# Patient Record
Sex: Male | Born: 1935 | Race: White | Hispanic: No | Smoking: Never smoker
Health system: Southern US, Community
[De-identification: ages and names within clinical notes are randomized; demographics above are authoritative.]

## PROBLEM LIST (undated history)

## (undated) DIAGNOSIS — I509 Heart failure, unspecified: Secondary | ICD-10-CM

## (undated) DIAGNOSIS — N289 Disorder of kidney and ureter, unspecified: Secondary | ICD-10-CM

## (undated) DIAGNOSIS — F0280 Dementia in other diseases classified elsewhere without behavioral disturbance: Secondary | ICD-10-CM

## (undated) DIAGNOSIS — G309 Alzheimer's disease, unspecified: Secondary | ICD-10-CM

---

## 2014-11-20 ENCOUNTER — Encounter (HOSPITAL_COMMUNITY): Payer: Self-pay

## 2014-11-20 ENCOUNTER — Inpatient Hospital Stay (HOSPITAL_COMMUNITY)
Admission: EM | Admit: 2014-11-20 | Discharge: 2014-11-28 | DRG: 291 | Disposition: A | Discharge status: Home Health | Payer: Medicare Other | Attending: Internal Medicine | Admitting: Internal Medicine

## 2014-11-20 ENCOUNTER — Emergency Department (HOSPITAL_COMMUNITY): Payer: Medicare Other

## 2014-11-20 DIAGNOSIS — R0602 Shortness of breath: Secondary | ICD-10-CM | POA: Diagnosis present

## 2014-11-20 DIAGNOSIS — Z7982 Long term (current) use of aspirin: Secondary | ICD-10-CM

## 2014-11-20 DIAGNOSIS — I5021 Acute systolic (congestive) heart failure: Secondary | ICD-10-CM

## 2014-11-20 DIAGNOSIS — Z888 Allergy status to other drugs, medicaments and biological substances status: Secondary | ICD-10-CM

## 2014-11-20 DIAGNOSIS — I959 Hypotension, unspecified: Secondary | ICD-10-CM | POA: Diagnosis present

## 2014-11-20 DIAGNOSIS — Z66 Do not resuscitate: Secondary | ICD-10-CM | POA: Diagnosis present

## 2014-11-20 DIAGNOSIS — N189 Chronic kidney disease, unspecified: Secondary | ICD-10-CM | POA: Diagnosis not present

## 2014-11-20 DIAGNOSIS — I5022 Chronic systolic (congestive) heart failure: Secondary | ICD-10-CM | POA: Insufficient documentation

## 2014-11-20 DIAGNOSIS — R10819 Abdominal tenderness, unspecified site: Secondary | ICD-10-CM

## 2014-11-20 DIAGNOSIS — R001 Bradycardia, unspecified: Secondary | ICD-10-CM | POA: Diagnosis present

## 2014-11-20 DIAGNOSIS — Z781 Physical restraint status: Secondary | ICD-10-CM

## 2014-11-20 DIAGNOSIS — R7989 Other specified abnormal findings of blood chemistry: Secondary | ICD-10-CM | POA: Diagnosis not present

## 2014-11-20 DIAGNOSIS — N184 Chronic kidney disease, stage 4 (severe): Secondary | ICD-10-CM | POA: Diagnosis present

## 2014-11-20 DIAGNOSIS — I509 Heart failure, unspecified: Secondary | ICD-10-CM

## 2014-11-20 DIAGNOSIS — F0281 Dementia in other diseases classified elsewhere with behavioral disturbance: Secondary | ICD-10-CM | POA: Diagnosis present

## 2014-11-20 DIAGNOSIS — N183 Chronic kidney disease, stage 3 unspecified: Secondary | ICD-10-CM | POA: Diagnosis present

## 2014-11-20 DIAGNOSIS — F05 Delirium due to known physiological condition: Secondary | ICD-10-CM | POA: Diagnosis present

## 2014-11-20 DIAGNOSIS — N179 Acute kidney failure, unspecified: Secondary | ICD-10-CM | POA: Diagnosis present

## 2014-11-20 DIAGNOSIS — I5023 Acute on chronic systolic (congestive) heart failure: Secondary | ICD-10-CM | POA: Diagnosis present

## 2014-11-20 DIAGNOSIS — F0280 Dementia in other diseases classified elsewhere without behavioral disturbance: Secondary | ICD-10-CM | POA: Diagnosis present

## 2014-11-20 DIAGNOSIS — R0600 Dyspnea, unspecified: Secondary | ICD-10-CM | POA: Diagnosis not present

## 2014-11-20 DIAGNOSIS — G309 Alzheimer's disease, unspecified: Secondary | ICD-10-CM | POA: Diagnosis present

## 2014-11-20 DIAGNOSIS — I2510 Atherosclerotic heart disease of native coronary artery without angina pectoris: Secondary | ICD-10-CM | POA: Diagnosis present

## 2014-11-20 DIAGNOSIS — R778 Other specified abnormalities of plasma proteins: Secondary | ICD-10-CM | POA: Diagnosis present

## 2014-11-20 DIAGNOSIS — Z79899 Other long term (current) drug therapy: Secondary | ICD-10-CM | POA: Diagnosis not present

## 2014-11-20 DIAGNOSIS — J9601 Acute respiratory failure with hypoxia: Secondary | ICD-10-CM | POA: Diagnosis present

## 2014-11-20 HISTORY — DX: Disorder of kidney and ureter, unspecified: N28.9

## 2014-11-20 HISTORY — DX: Alzheimer's disease, unspecified: G30.9

## 2014-11-20 HISTORY — DX: Heart failure, unspecified: I50.9

## 2014-11-20 HISTORY — DX: Dementia in other diseases classified elsewhere, unspecified severity, without behavioral disturbance, psychotic disturbance, mood disturbance, and anxiety: F02.80

## 2014-11-20 LAB — BASIC METABOLIC PANEL
Anion gap: 7 (ref 5–15)
BUN: 26 mg/dL — ABNORMAL HIGH (ref 6–20)
CHLORIDE: 106 mmol/L (ref 101–111)
CO2: 27 mmol/L (ref 22–32)
Calcium: 8.4 mg/dL — ABNORMAL LOW (ref 8.9–10.3)
Creatinine, Ser: 1.54 mg/dL — ABNORMAL HIGH (ref 0.61–1.24)
GFR calc Af Amer: 45 mL/min — ABNORMAL LOW (ref >60)
GFR, EST NON AFRICAN AMERICAN: 39 mL/min — AB (ref >60)
GLUCOSE: 105 mg/dL — AB (ref 65–99)
POTASSIUM: 4.4 mmol/L (ref 3.5–5.1)
SODIUM: 140 mmol/L (ref 135–145)

## 2014-11-20 LAB — CBC WITH DIFFERENTIAL/PLATELET
Basophils Absolute: 0 10*3/uL (ref 0.0–0.1)
Basophils Relative: 1 % (ref 0–1)
Eosinophils Absolute: 0.2 10*3/uL (ref 0.0–0.7)
Eosinophils Relative: 4 % (ref 0–5)
HCT: 38.8 % — ABNORMAL LOW (ref 39.0–52.0)
Hemoglobin: 12.5 g/dL — ABNORMAL LOW (ref 13.0–17.0)
LYMPHS ABS: 1.5 10*3/uL (ref 0.7–4.0)
Lymphocytes Relative: 27 % (ref 12–46)
MCH: 28.8 pg (ref 26.0–34.0)
MCHC: 32.2 g/dL (ref 30.0–36.0)
MCV: 89.4 fL (ref 78.0–100.0)
MONO ABS: 0.6 10*3/uL (ref 0.1–1.0)
Monocytes Relative: 11 % (ref 3–12)
NEUTROS PCT: 57 % (ref 43–77)
Neutro Abs: 3.2 10*3/uL (ref 1.7–7.7)
Platelets: 235 10*3/uL (ref 150–400)
RBC: 4.34 MIL/uL (ref 4.22–5.81)
RDW: 13.5 % (ref 11.5–15.5)
WBC: 5.5 10*3/uL (ref 4.0–10.5)

## 2014-11-20 LAB — BRAIN NATRIURETIC PEPTIDE: B NATRIURETIC PEPTIDE 5: 1133 pg/mL — AB (ref 0.0–100.0)

## 2014-11-20 LAB — PROTIME-INR
INR: 1.13 (ref 0.00–1.49)
Prothrombin Time: 14.7 seconds (ref 11.6–15.2)

## 2014-11-20 LAB — TROPONIN I: Troponin I: 0.04 ng/mL — ABNORMAL HIGH (ref <0.031)

## 2014-11-20 LAB — APTT: APTT: 27 s (ref 24–37)

## 2014-11-20 MED ORDER — ALBUTEROL SULFATE (2.5 MG/3ML) 0.083% IN NEBU
5.0000 mg | INHALATION_SOLUTION | Freq: Once | RESPIRATORY_TRACT | Status: AC
  Administered 2014-11-20: 5 mg via RESPIRATORY_TRACT
  Filled 2014-11-20: qty 6

## 2014-11-20 MED ORDER — FUROSEMIDE 10 MG/ML IJ SOLN
40.0000 mg | INTRAMUSCULAR | Status: AC
  Administered 2014-11-20: 40 mg via INTRAVENOUS
  Filled 2014-11-20: qty 4

## 2014-11-20 MED ORDER — ASPIRIN 81 MG PO CHEW
324.0000 mg | CHEWABLE_TABLET | Freq: Once | ORAL | Status: AC
  Administered 2014-11-20: 324 mg via ORAL
  Filled 2014-11-20: qty 4

## 2014-11-20 NOTE — ED Provider Notes (Signed)
CSN: [id number]     Arrival date & time 11/20/14  2113 History   None    This chart was scribed for Eber HongBrian Haillee Johann, MD by Arlan OrganAshley Leger, ED Scribe. This patient was seen in room APA01/APA01 and the patient's care was started 9:14 PM.   Chief Complaint  Patient presents with  . Shortness of Breath   The history is provided by the patient. No language interpreter was used.    LEVEL 5 CAVEAT DUE TO HISTORY OF DEMENTIA  HPI Comments: John [facility] brought in by EMS [facility] is a 81 y.o. male without any pertinent past medical history who presents to the Emergency Department here for constant, ongoing shortness of breath on exertion this evening. Staff noticed pt looked more short of breath after eating dinner this evening. He denies any pain at this time. No tachycardia or hypoxia per EMS. No interventions reported. No known allergies to medications. The pt follows commands but is non verbal.  Past Medical History  Diagnosis Date  . CHF (congestive heart failure)   . Alzheimer's dementia   . Renal disorder    History reviewed. No pertinent past surgical history. No family history on file. History  Substance Use Topics  . Smoking status: Never Smoker   . Smokeless tobacco: Not on file  . Alcohol Use: No    Review of Systems  Unable to perform ROS: Dementia  Respiratory: Positive for shortness of breath.   All other systems reviewed and are negative.     Allergies  Ace inhibitors  Home Medications   Prior to Admission medications   Medication Sig Start Date End Date Taking? Authorizing Provider  acetaminophen (TYLENOL) 500 MG tablet Take 500 mg by mouth every 4 (four) hours as needed (**To take every 4 hours as needed for 24 hours for fever 99.5 to 101. to monitor temperature three times daily until normal. If over 101, contact MD but not to exceed 2000mg  in 24 hours).   Yes Historical Provider, MD  alum & mag hydroxide-simeth (MAALOX/MYLANTA)  200-200-20 MG/5ML suspension Take 30 mLs by mouth daily as needed for indigestion or heartburn.   Yes Historical Provider, MD  aspirin EC 81 MG tablet Take 81 mg by mouth daily.   Yes Historical Provider, MD  Cholecalciferol (VITAMIN D) 2000 UNITS CAPS Take 1 capsule by mouth daily.   Yes Historical Provider, MD  docusate sodium (COLACE) 100 MG capsule Take 100 mg by mouth 2 (two) times daily.   Yes Historical Provider, MD  guaiFENesin (ROBITUSSIN) 100 MG/5ML liquid Take 200 mg by mouth every 6 (six) hours as needed for cough.   Yes Historical Provider, MD  loperamide (IMODIUM) 2 MG capsule Take 2 mg by mouth as needed for diarrhea or loose stools.   Yes Historical Provider, MD  LORazepam in dextrose solution Inject 1 mg into the vein every 6 (six) hours as needed (for anxiety. **Not to exceed 3 doses within 24 hours   Yes Historical Provider, MD  losartan (COZAAR) 25 MG tablet Take 12.5 mg by mouth daily.   Yes Historical Provider, MD  magnesium hydroxide (MILK OF MAGNESIA) 400 MG/5ML suspension Take 30 mLs by mouth at bedtime as needed for mild constipation.   Yes Historical Provider, MD  memantine (NAMENDA) 10 MG tablet Take 10 mg by mouth 2 (two) times daily.   Yes Historical Provider, MD  mirtazapine (REMERON) 45 MG tablet Take 45 mg by mouth at bedtime.   Yes Historical  Provider, MD  OLANZapine (ZYPREXA) 5 MG tablet Take 5 mg by mouth every morning.   Yes Historical Provider, MD  risperiDONE (RISPERDAL) 2 MG tablet Take 2 mg by mouth 2 (two) times daily.   Yes Historical Provider, MD  traZODone (DESYREL) 100 MG tablet Take 100 mg by mouth at bedtime.   Yes Historical Provider, MD  polyethylene glycol (MIRALAX / GLYCOLAX) packet Take 17 g by mouth daily.    Historical Provider, MD   Triage Vitals: BP 108/75 mmHg  Pulse 94  Temp(Src) 98.1 F (36.7 C) (Oral)  Resp 23  SpO2 100%   Physical Exam  Constitutional: He appears well-developed and well-nourished. No distress.  HENT:  Head:  Normocephalic and atraumatic.  Mouth/Throat: Oropharynx is clear and moist. No oropharyngeal exudate.  Eyes: Conjunctivae and EOM are normal. Pupils are equal, round, and reactive to light. Right eye exhibits no discharge. Left eye exhibits no discharge. No scleral icterus.  Neck: Normal range of motion. Neck supple. No JVD present. No thyromegaly present.  Cardiovascular: Normal rate, regular rhythm, normal heart sounds and intact distal pulses.  Exam reveals no gallop and no friction rub.   No murmur heard. Pulmonary/Chest: Effort normal. No respiratory distress. He has wheezes. He has rales.  Soft  rales at bases with expiratory wheezing Mild tachypnea   Abdominal: Soft. Bowel sounds are normal. He exhibits no distension and no mass. There is no tenderness.  Musculoskeletal: Normal range of motion. He exhibits edema. He exhibits no tenderness.  Edema 1 plus bilaterally, symmetrical   Lymphadenopathy:    He has no cervical adenopathy.  Neurological: Coordination normal.  Follows commands but disoriented   Skin: Skin is warm and dry. No rash noted. No erythema.  Superficial ulcerations to L anterior shin x 3 Normal capillary refill at the toes  Psychiatric: He has a normal mood and affect. His behavior is normal.  Nursing note and vitals reviewed.   ED Course  Procedures (including critical care time)  DIAGNOSTIC STUDIES: Oxygen Saturation is 98% on RA, Normal by my interpretation.    COORDINATION OF CARE: 9:28 PM- Will order CXR, BMP, CBC, APTT, Protime-INR, Troponin I, brain natriuretic peptide, and EKG. Discussed treatment plan with pt at bedside and pt agreed to plan.     Labs Review Labs Reviewed  BASIC METABOLIC PANEL - Abnormal; Notable for the following:    Glucose, Bld 105 (*)    BUN 26 (*)    Creatinine, Ser 1.54 (*)    Calcium 8.4 (*)    GFR calc non Af Amer 39 (*)    GFR calc Af Amer 45 (*)    All other components within normal limits  CBC WITH  DIFFERENTIAL/PLATELET - Abnormal; Notable for the following:    Hemoglobin 12.5 (*)    HCT 38.8 (*)    All other components within normal limits  TROPONIN I - Abnormal; Notable for the following:    Troponin I 0.04 (*)    All other components within normal limits  BRAIN NATRIURETIC PEPTIDE - Abnormal; Notable for the following:    B Natriuretic Peptide 1133.0 (*)    All other components within normal limits  APTT  PROTIME-INR    Imaging Review Dg Chest Port 1 View  11/20/2014   CLINICAL DATA:  Shortness of breath for a couple hours, generalized chest pain in past 15 minutes, history CHF, dementia, renal disease  EXAM: PORTABLE CHEST - 1 VIEW  COMPARISON:  Portable exam 2144 hours without priors  for comparison  FINDINGS: Enlargement of cardiac silhouette with pulmonary vascular congestion.  Mediastinal contours normal.  Perihilar infiltrates compatible with pulmonary edema and CHF.  No segmental consolidation, pleural effusion or pneumothorax.  Bones demineralized.  IMPRESSION: CHF.   Electronically Signed   By: Ulyses SouthwardMark  Boles M.D.   On: 11/20/2014 22:17     EKG Interpretation   Date/Time:  Thursday Nov 20 2014 21:29:05 EDT Ventricular Rate:  100 PR Interval:  229 QRS Duration: 125 QT Interval:  384 QTC Calculation: 495 R Axis:   -29 Text Interpretation:  Sinus tachycardia Paired ventricular premature  complexes Prolonged PR interval Probable left ventricular hypertrophy  Anterior Q waves, possibly due to LVH Abnormal T, consider ischemia,  lateral leads Abnormal ekg No old tracing to compare Confirmed by Heaven Wandell   MD, Veldon Wager ([id number]) on 11/20/2014 11:04:47 PM      MDM   Final diagnoses:  SOB (shortness of breath)  Acute systolic congestive heart failure   The patient has congestive heart failure, his blood pressure is 103/70, heart rate is normal, oxygenation is 97% however on exam he has rales, cardiac wheeze in peripheral edema consistent with fluid retention. His medical record  suggests that he has congestive heart failure, systolic. There is no echocardiogram on the chart, the patient is unable to give me any other information due to his dementia. He is tachypneic, he will get Lasix, albuterol nebulizer, admission to the hospitalist service for fluid overload with acute systolic congestive heart failure exacerbation. The patient does not need advanced noninvasive ventilation at this time.  D/w Dr. Onalee Huaavid who will admit  Meds given in ED:  Medications  furosemide (LASIX) injection 40 mg (not administered)  aspirin chewable tablet 324 mg (not administered)  albuterol (PROVENTIL) (2.5 MG/3ML) 0.083% nebulizer solution 5 mg (5 mg Nebulization Given 11/20/14 2312)    New Prescriptions   No medications on file    I personally performed the services described in this documentation, which was scribed in my presence. The recorded information has been reviewed and is accurate.     Eber HongBrian Jettson Crable, MD 11/20/14 [phone]

## 2014-11-20 NOTE — ED Notes (Signed)
[facility] updated on plan of care,

## 2014-11-20 NOTE — ED Notes (Signed)
Son's contact information [facility] [place] phone first [phone] Home [phone]

## 2014-11-20 NOTE — ED Notes (Signed)
Pt has wheezing noted, appears sob, Dr Hyacinth MeekerMiller notified, additional orders given,

## 2014-11-20 NOTE — ED Notes (Signed)
[facility] by ems after getting sob right after eating supper tonight.  Pt denies pain, appears minimally sob at this time

## 2014-11-20 NOTE — ED Notes (Signed)
Son states that he has noticed that pt has been sob for the past two days,

## 2014-11-20 NOTE — ED Notes (Signed)
After giving pt the aspirin, pt coughed for several times,

## 2014-11-20 NOTE — ED Notes (Signed)
Pt c/o sob that started this evening, denies any pain, lab in room at present time, pt NSR on monitor,

## 2014-11-21 ENCOUNTER — Inpatient Hospital Stay (HOSPITAL_COMMUNITY): Payer: Medicare Other

## 2014-11-21 DIAGNOSIS — I5023 Acute on chronic systolic (congestive) heart failure: Secondary | ICD-10-CM | POA: Diagnosis present

## 2014-11-21 DIAGNOSIS — R0600 Dyspnea, unspecified: Secondary | ICD-10-CM

## 2014-11-21 DIAGNOSIS — I509 Heart failure, unspecified: Secondary | ICD-10-CM

## 2014-11-21 DIAGNOSIS — I5022 Chronic systolic (congestive) heart failure: Secondary | ICD-10-CM

## 2014-11-21 DIAGNOSIS — R0602 Shortness of breath: Secondary | ICD-10-CM

## 2014-11-21 DIAGNOSIS — N189 Chronic kidney disease, unspecified: Secondary | ICD-10-CM

## 2014-11-21 DIAGNOSIS — J9601 Acute respiratory failure with hypoxia: Secondary | ICD-10-CM

## 2014-11-21 DIAGNOSIS — R7989 Other specified abnormal findings of blood chemistry: Secondary | ICD-10-CM

## 2014-11-21 DIAGNOSIS — F0280 Dementia in other diseases classified elsewhere without behavioral disturbance: Secondary | ICD-10-CM

## 2014-11-21 DIAGNOSIS — G309 Alzheimer's disease, unspecified: Secondary | ICD-10-CM

## 2014-11-21 LAB — TROPONIN I
TROPONIN I: 0.04 ng/mL — AB (ref <0.031)
Troponin I: 0.03 ng/mL (ref <0.031)
Troponin I: 0.03 ng/mL (ref <0.031)

## 2014-11-21 MED ORDER — SODIUM CHLORIDE 0.9 % IJ SOLN: 3.0000 mL | INTRAMUSCULAR | Status: DC | PRN

## 2014-11-21 MED ORDER — MIRTAZAPINE 30 MG PO TABS
45.0000 mg | ORAL_TABLET | Freq: Every day | ORAL | Status: DC
  Administered 2014-11-21 – 2014-11-27 (×7): 45 mg via ORAL
  Filled 2014-11-21 (×18): qty 1

## 2014-11-21 MED ORDER — FUROSEMIDE 10 MG/ML IJ SOLN
40.0000 mg | Freq: Every day | INTRAMUSCULAR | Status: DC
  Administered 2014-11-21 – 2014-11-24 (×4): 40 mg via INTRAVENOUS
  Filled 2014-11-21 (×4): qty 4

## 2014-11-21 MED ORDER — ONDANSETRON HCL 4 MG/2ML IJ SOLN: 4.0000 mg | Freq: Three times a day (TID) | INTRAMUSCULAR | Status: AC | PRN

## 2014-11-21 MED ORDER — ONDANSETRON HCL 4 MG/2ML IJ SOLN: 4.0000 mg | Freq: Four times a day (QID) | INTRAMUSCULAR | Status: DC | PRN

## 2014-11-21 MED ORDER — ENOXAPARIN SODIUM 40 MG/0.4ML ~~LOC~~ SOLN
40.0000 mg | SUBCUTANEOUS | Status: DC
  Administered 2014-11-21 – 2014-11-28 (×8): 40 mg via SUBCUTANEOUS
  Filled 2014-11-21 (×8): qty 0.4

## 2014-11-21 MED ORDER — ENOXAPARIN SODIUM 40 MG/0.4ML ~~LOC~~ SOLN: 40.0000 mg | SUBCUTANEOUS | Status: DC

## 2014-11-21 MED ORDER — ASPIRIN EC 81 MG PO TBEC
81.0000 mg | DELAYED_RELEASE_TABLET | Freq: Every day | ORAL | Status: DC
  Administered 2014-11-21 – 2014-11-28 (×8): 81 mg via ORAL
  Filled 2014-11-21 (×8): qty 1

## 2014-11-21 MED ORDER — LORAZEPAM 0.5 MG PO TABS
0.5000 mg | ORAL_TABLET | Freq: Two times a day (BID) | ORAL | Status: DC | PRN
  Administered 2014-11-22 – 2014-11-24 (×5): 0.5 mg via ORAL
  Filled 2014-11-21 (×5): qty 1

## 2014-11-21 MED ORDER — SODIUM CHLORIDE 0.9 % IJ SOLN
3.0000 mL | Freq: Two times a day (BID) | INTRAMUSCULAR | Status: DC
  Administered 2014-11-21 – 2014-11-28 (×16): 3 mL via INTRAVENOUS

## 2014-11-21 MED ORDER — SODIUM CHLORIDE 0.9 % IV SOLN: 250.0000 mL | INTRAVENOUS | Status: DC | PRN

## 2014-11-21 MED ORDER — LORAZEPAM 2 MG/ML IJ SOLN
1.0000 mg | Freq: Once | INTRAMUSCULAR | Status: AC
  Administered 2014-11-21: 1 mg via INTRAVENOUS
  Filled 2014-11-21: qty 1

## 2014-11-21 MED ORDER — RISPERIDONE 1 MG PO TABS
2.0000 mg | ORAL_TABLET | Freq: Two times a day (BID) | ORAL | Status: DC
  Administered 2014-11-21 – 2014-11-28 (×15): 2 mg via ORAL
  Filled 2014-11-21 (×2): qty 2
  Filled 2014-11-21: qty 1
  Filled 2014-11-21: qty 2
  Filled 2014-11-21: qty 1
  Filled 2014-11-21 (×11): qty 2
  Filled 2014-11-21: qty 1
  Filled 2014-11-21 (×2): qty 2
  Filled 2014-11-21: qty 1

## 2014-11-21 MED ORDER — LORAZEPAM 2 MG/ML IJ SOLN
INTRAMUSCULAR | Status: AC
  Filled 2014-11-21: qty 1

## 2014-11-21 MED ORDER — LORAZEPAM 2 MG/ML IJ SOLN
1.0000 mg | Freq: Once | INTRAMUSCULAR | Status: AC
  Administered 2014-11-21: 1 mg via INTRAVENOUS

## 2014-11-21 MED ORDER — MEMANTINE HCL 10 MG PO TABS
10.0000 mg | ORAL_TABLET | Freq: Two times a day (BID) | ORAL | Status: DC
  Administered 2014-11-21 – 2014-11-28 (×15): 10 mg via ORAL
  Filled 2014-11-21 (×20): qty 1

## 2014-11-21 MED ORDER — ACETAMINOPHEN 325 MG PO TABS
650.0000 mg | ORAL_TABLET | ORAL | Status: DC | PRN
  Administered 2014-11-23: 650 mg via ORAL
  Filled 2014-11-21: qty 2

## 2014-11-21 MED ORDER — CARVEDILOL 3.125 MG PO TABS
3.1250 mg | ORAL_TABLET | Freq: Two times a day (BID) | ORAL | Status: DC
  Administered 2014-11-22 – 2014-11-28 (×9): 3.125 mg via ORAL
  Filled 2014-11-21 (×11): qty 1

## 2014-11-21 MED ORDER — TRAZODONE HCL 50 MG PO TABS
100.0000 mg | ORAL_TABLET | Freq: Every day | ORAL | Status: DC
  Administered 2014-11-21 – 2014-11-27 (×8): 100 mg via ORAL
  Filled 2014-11-21 (×8): qty 2

## 2014-11-21 MED ORDER — POLYETHYLENE GLYCOL 3350 17 G PO PACK
17.0000 g | PACK | Freq: Every day | ORAL | Status: DC
  Administered 2014-11-21 – 2014-11-28 (×7): 17 g via ORAL
  Filled 2014-11-21 (×8): qty 1

## 2014-11-21 NOTE — Progress Notes (Signed)
Contacted [facility] in regards to pt's code status since it wasn't documented in the paper work. [facility] states that the pt is a full code.

## 2014-11-21 NOTE — Progress Notes (Signed)
This patient was admitted to the hospital earlier this morning by Dr. Onalee Huaavid  Patient seen and examined. Noted to have crackles in the lower lobes bilaterally. No significant lower extremity edema.  He was admitted with shortness of breath, acute respiratory failure with hypoxia and acute on chronic systolic congestive heart failure. Echocardiogram done today shows an ejection fraction of 20-25% with diffuse hypokinesis. He is started on intravenous Lasix with good urine output. We'll try to wean down oxygen as tolerated. Creatinine is mildly elevated at 1.5. Unclear if this is baseline since we do not have any prior blood work. We'll start him on low-dose Coreg. Continue to monitor urine output.  Nadelyn Enriques

## 2014-11-21 NOTE — Care Management Note (Signed)
Case Management Note  Patient Details  Name: John RoanLassie Kidd MRN: [id number] Date of Birth: 04/12/1927  Expected Discharge Date:                  Expected Discharge Plan:  Assisted Living / Rest Home  In-House Referral:  Clinical Social Work  Discharge planning Services  CM Consult  Post Acute Care Choice:  NA Choice offered to:  NA  DME Arranged:    DME Agency:     HH Arranged:    HH Agency:     Status of Service:  Completed, signed off  Medicare Important Message Given:  Yes Date Medicare IM Given:  11/21/14 Medicare IM give by:  Kathyrn SheriffJessica Montae Stager, RN, MSN, CM  Date Additional Medicare IM Given:    Additional Medicare Important Message give by:     If discussed at Long Length of Stay Meetings, dates discussed:    Additional Comments: [facility]. Pt admitted for CHF. Discharge plan is for patient to return to ALF at DC. CSW is aware and will arrange for return to facility. No CM needs.   [name], Anela Bensman Demske, RN 11/21/2014, 8:57 AM

## 2014-11-21 NOTE — Clinical Social Work Note (Addendum)
Clinical Social Work Assessment  Patient Details  Name: John Kidd MRN: [id number] Date of Birth: 01/06/1927  Date of referral:  11/21/14               Reason for consult:  Facility Placement                Permission sought to share information with:    Permission granted to share information::     Name::        Agency::     Relationship::     Contact Information:     Housing/Transportation Living arrangements for the past 2 months:  Assisted Living Facility Source of Information:  Spouse Patient Interpreter Needed:  None Criminal Activity/Legal Involvement Pertinent to Current Situation/Hospitalization:  No - Comment as needed Significant Relationships:  Adult Children, Spouse Lives with:  Facility Resident Do you feel safe going back to the place where you live?  Yes Need for family participation in patient care:  Yes (Comment) (Family visit very regularly)  Care giving concerns:  [facility].    Social Worker assessment / plan:  CSW spoke with pt's wife, John Kidd on phone as pt is oriented to self only and very agitated. Pt has [facility] a [facility] on the memory care unit for over a year now. She states that her son drives her to see pt often. They plan to come to hospital today. John Kidd has been pleased with facility and requests return at d/c. Pt's son, John Kidd also called and verified information. [facility], pt ambulates independently, but requires assist with all other ADLs. He can be combative with staff there. Okay for return unless decline in functional status requires assessment.   Employment status:  Retired Database administratornsurance information:  Managed Medicare PT Recommendations:  Not assessed at this time Information / Referral to community resources:   [employer]([facility])  Patient/Family's Response to care:  Pt's [facility] She states pt has been doing well there.   Patient/Family's Understanding of and  Emotional Response to Diagnosis, Current Treatment, and Prognosis:  Pt's wife shared that pt was at home with her until January 2015. Pt did not want to sleep, was wandering, and weak. She felt that he needed to be on memory care unit for safety and has been happy with this decision.   Emotional Assessment Appearance:  Other (Comment Required (not assessed) Attitude/Demeanor/Rapport:  Combative Affect (typically observed):  Unable to Assess Orientation:  Oriented to Self Alcohol / Substance use:  Not Applicable Psych involvement (Current and /or in the community):  No (Comment)  Discharge Needs  Concerns to be addressed:  Discharge Planning Concerns Readmission within the last 30 days:  No Current discharge risk:  Cognitively Impaired Barriers to Discharge:  Continued Medical Work up   Karn CassisStultz, Caidan Hubbert Shanaberger, LCSW 11/21/2014, 9:24 AM [phone]

## 2014-11-21 NOTE — H&P (Signed)
PCP:   No primary care provider on file.   Chief Complaint:  sob  HPI: 81 yo male from SNF with h/o systolic chf, advanced dementia, ckd sent in for worsening sob.  History all obtained from ER staff, and snf staff.  No report of fevers.  Mild peripheral edema.  Pt cannot express any pain, but appears to be sob and comfortable.    Review of Systems:  Unobtainable from patietn due to dementia  Past Medical History: Past Medical History  Diagnosis Date  . CHF (congestive heart failure)   . Alzheimer's dementia   . Renal disorder    History reviewed. No pertinent past surgical history.  Medications: Prior to Admission medications   Medication Sig Start Date End Date Taking? Authorizing Provider  acetaminophen (TYLENOL) 500 MG tablet Take 500 mg by mouth every 4 (four) hours as needed (**To take every 4 hours as needed for 24 hours for fever 99.5 to 101. to monitor temperature three times daily until normal. If over 101, contact MD but not to exceed 2000mg  in 24 hours).   Yes Historical Provider, MD  alum & mag hydroxide-simeth (MAALOX/MYLANTA) 200-200-20 MG/5ML suspension Take 30 mLs by mouth daily as needed for indigestion or heartburn.   Yes Historical Provider, MD  aspirin EC 81 MG tablet Take 81 mg by mouth daily.   Yes Historical Provider, MD  Cholecalciferol (VITAMIN D) 2000 UNITS CAPS Take 1 capsule by mouth daily.   Yes Historical Provider, MD  docusate sodium (COLACE) 100 MG capsule Take 100 mg by mouth 2 (two) times daily.   Yes Historical Provider, MD  guaiFENesin (ROBITUSSIN) 100 MG/5ML liquid Take 200 mg by mouth every 6 (six) hours as needed for cough.   Yes Historical Provider, MD  loperamide (IMODIUM) 2 MG capsule Take 2 mg by mouth as needed for diarrhea or loose stools.   Yes Historical Provider, MD  LORazepam in dextrose solution Inject 1 mg into the vein every 6 (six) hours as needed (for anxiety. **Not to exceed 3 doses within 24 hours.male ).   Yes Historical  Provider, MD  losartan (COZAAR) 25 MG tablet Take 12.5 mg by mouth daily.   Yes Historical Provider, MD  magnesium hydroxide (MILK OF MAGNESIA) 400 MG/5ML suspension Take 30 mLs by mouth at bedtime as needed for mild constipation.   Yes Historical Provider, MD  memantine (NAMENDA) 10 MG tablet Take 10 mg by mouth 2 (two) times daily.   Yes Historical Provider, MD  mirtazapine (REMERON) 45 MG tablet Take 45 mg by mouth at bedtime.   Yes Historical Provider, MD  OLANZapine (ZYPREXA) 5 MG tablet Take 5 mg by mouth every morning.   Yes Historical Provider, MD  risperiDONE (RISPERDAL) 2 MG tablet Take 2 mg by mouth 2 (two) times daily.   Yes Historical Provider, MD  traZODone (DESYREL) 100 MG tablet Take 100 mg by mouth at bedtime.   Yes Historical Provider, MD  polyethylene glycol (MIRALAX / GLYCOLAX) packet Take 17 g by mouth daily.    Historical Provider, MD    Allergies:   Allergies  Allergen Reactions  . Ace Inhibitors     Social History:  reports that he has never smoked. He does not have any smokeless tobacco history on file. He reports that he does not drink alcohol or use illicit drugs.  Family History: Unobtainable due to pt dementia  Physical Exam: Filed Vitals:   11/20/14 2312 11/20/14 2313 11/20/14 2330 11/20/14 2351  BP:  108/75  110/72   Pulse:  94 84   Temp:    97.6 F (36.4 C)  TempSrc:    Oral  Resp:  23    SpO2: 100% 100% 100%    General appearance: alert, cooperative and no distress Head: Normocephalic, without obvious abnormality, atraumatic Eyes: negative Nose: Nares normal. Septum midline. Mucosa normal. No drainage or sinus tenderness. Neck: no JVD and supple, symmetrical, trachea midline Lungs: rales bilaterally Heart: regular rate and rhythm, S1, S2 normal, no murmur, click, rub or gallop Abdomen: soft, non-tender; bowel sounds normal; no masses,  no organomegaly Extremities: extremities normal, atraumatic, no cyanosis.  1+ ble edema Pulses: 2+ and  symmetric Skin: Skin color, texture, turgor normal. No rashes or lesions Neurologic: Grossly normal    Labs on Admission:   Recent Labs  11/20/14 2148  NA 140  K 4.4  CL 106  CO2 27  GLUCOSE 105*  BUN 26*  CREATININE 1.54*  CALCIUM 8.4*    Recent Labs  11/20/14 2148  WBC 5.5  NEUTROABS 3.2  HGB 12.5*  HCT 38.8*  MCV 89.4  PLT 235    Recent Labs  11/20/14 2148  TROPONINI 0.04*   Radiological Exams on Admission: Dg Chest Port 1 View  11/20/2014   CLINICAL DATA:  Shortness of breath for a couple hours, generalized chest pain in past 15 minutes, history CHF, dementia, renal disease  EXAM: PORTABLE CHEST - 1 VIEW  COMPARISON:  Portable exam 2144 hours without priors for comparison  FINDINGS: Enlargement of cardiac silhouette with pulmonary vascular congestion.  Mediastinal contours normal.  Perihilar infiltrates compatible with pulmonary edema and CHF.  No segmental consolidation, pleural effusion or pneumothorax.  Bones demineralized.  IMPRESSION: CHF.   Electronically Signed   By: Ulyses SouthwardMark  Boles M.D.   On: 11/20/2014 22:17   cxr and ekg reviewed by myself Old records from snf reviewed by myself, no mention of code status.  Assessment/Plan  81 yo male with sob, edema acute on chronic systolic chf exacerbation  Principal Problem:   CHF exacerbation-  Not on lasix at snf.  Place on chf pathway, iv lasix 40mg  iv daily, order echo for am.  Monitor closely for worsening respiratory status.    Active Problems:   Alzheimer's dementia- advanced   CKD (chronic kidney disease)-  Unknown baseline cr.  Monitor daily, repeat level in am.   Chronic systolic CHF (congestive heart failure)-  Noted, not on much heart failure medications.   SOB (shortness of breath)   Elevated troponin-  Could be demand ischemia, continue to serial troponin overnight, not good candidate for aggressive intervention.  Aspirin.  Admit to telemetry bed.  High risk for deterioration.  Presumptive full  code.  Asked nursing staff to call SNF to clarify code status.  DAVID,RACHAL A 11/21/2014, 12:07 AM

## 2014-11-21 NOTE — ED Notes (Signed)
Report given to the floor,   

## 2014-11-21 NOTE — Progress Notes (Signed)
Pt came up and was very agitated and combative towards the staff. He was also pulling at the lines. Placed mittens on pt but he only bit them off. Dr.David gave a one time order for 1 mg of Ativan IV. Time had went by and pt still attempting to get out of bed and fighting the staff. Dr. Onalee Huaavid gave another one time order for 1 mg of ativan and follow up with restraints if need be. Pt has been assigned a sitter and is currently sleeping at this time. Will continue to monitor.

## 2014-11-21 NOTE — ED Notes (Signed)
[facility] [phone] Attempted to call [facility] to notify them of pt's admission with no answer,

## 2014-11-22 DIAGNOSIS — N179 Acute kidney failure, unspecified: Secondary | ICD-10-CM

## 2014-11-22 LAB — BASIC METABOLIC PANEL
ANION GAP: 4 — AB (ref 5–15)
BUN: 21 mg/dL — ABNORMAL HIGH (ref 6–20)
CO2: 28 mmol/L (ref 22–32)
Calcium: 8.2 mg/dL — ABNORMAL LOW (ref 8.9–10.3)
Chloride: 106 mmol/L (ref 101–111)
Creatinine, Ser: 1.37 mg/dL — ABNORMAL HIGH (ref 0.61–1.24)
GFR, EST AFRICAN AMERICAN: 52 mL/min — AB (ref >60)
GFR, EST NON AFRICAN AMERICAN: 45 mL/min — AB (ref >60)
GLUCOSE: 96 mg/dL (ref 65–99)
Potassium: 4.2 mmol/L (ref 3.5–5.1)
SODIUM: 138 mmol/L (ref 135–145)

## 2014-11-22 MED ORDER — LORAZEPAM 0.5 MG PO TABS
0.7500 mg | ORAL_TABLET | Freq: Once | ORAL | Status: AC
  Administered 2014-11-22: 0.75 mg via ORAL
  Filled 2014-11-22: qty 2

## 2014-11-22 MED ORDER — LORAZEPAM 2 MG/ML IJ SOLN
0.2500 mg | Freq: Once | INTRAMUSCULAR | Status: DC
  Filled 2014-11-22 (×2): qty 1

## 2014-11-22 NOTE — Progress Notes (Signed)
Patient removes oxygen tubing and telemetry unit at intervals. Patient pulling at restraints. IV wrapped with gauze. Sitter at bedside. Medication given in pudding.

## 2014-11-22 NOTE — Progress Notes (Signed)
Patient restless swinging legs over bed rails. Pulling at nurse tech jacket. Confused conversation noted. Patient given small amount of applesauce tolerated well. Family in to visit earlier. Patient pulled IV out right hand earlier. New IV started.

## 2014-11-22 NOTE — Progress Notes (Signed)
Pt had a sitter. Pt was very agitated and aggressive towards staff, security, and law enforcement. Pt was kicking and fighting. Dr. Rueben BashKakrandy was asked to come see pt due to new onset of confusion. MD ordered Ativan and 2 point wrist restraints for pt due to pt removing medical equipment and unsafe behaviors.

## 2014-11-22 NOTE — Progress Notes (Signed)
[facility] PROGRESS NOTE  John Kidd ZOX:[id number] DOB: 09-24-1926 DOA: 11/20/2014 PCP: No primary care provider on file.  Assessment/Plan: 1. Acute respiratory failure with hypoxia. Related to congestive heart failure. Continue to wean down oxygen as tolerated. 2. Acute systolic congestive heart failure. Echocardiogram shows ejection fraction of 20-25%. Patient is on intravenous Lasix with good urine output. Continue current treatments. Patient's family is not aware of any other recent cardiac testing. 3. Acute renal failure. Likely related to the low output. Improving with diuresis. Continue to follow. 4. Advanced Alzheimer's dementia. He is a resident of a memory care unit.  Code Status: DNR Family Communication: discussed with patient's son and his brother at the bedside Disposition Plan: discharge to [facility] when adequately diuresed   Consultants:    Procedures:  Echo: - Left ventricle: The cavity size was normal. Wall thickness was normal. Systolic function was severely reduced. The estimated ejection fraction was in the range of 20% to 25%. Diastolic function is abnormal, indeterminate grade. Diffuse hypokinesis. - Aortic valve: Moderately calcified annulus. Trileaflet; moderately thickened leaflets. Valve area (VTI): 2.34 cm^2. Valve area (Vmax): 2.65 cm^2. - Mitral valve: Mildly calcified annulus. Mildly thickened leaflets . There was mild regurgitation. The MR vena contracta is 0.3 cm. - Left atrium: The atrium was mildly dilated. - Right ventricle: The cavity size was mildly dilated. - Tricuspid valve: There was moderate regurgitation. - Pulmonary arteries: Systolic pressure was moderately increased. PA peak pressure: 50 mm Hg (S). - Inferior vena cava: The vessel was dilated. The respirophasic diameter changes were blunted (< 50%), consistent with elevated central venous pressure.  Antibiotics:    HPI/Subjective: Cannot  provide history due to dementia, was combative overnight  Objective: Filed Vitals:   11/22/14 0615  BP: 140/96  Pulse: 70  Temp: 97.6 F (36.4 C)  Resp: 20    Intake/Output Summary (Last 24 hours) at 11/22/14 1245 Last data filed at 11/21/14 2341  Gross per 24 hour  Intake    120 ml  Output    500 ml  Net   -380 ml   Filed Weights   11/21/14 0100 11/21/14 0700 11/22/14 0648  Weight: 92.6 kg (204 lb 2.3 oz) 89.8 kg (197 lb 15.6 oz) 88.8 kg (195 lb 12.3 oz)    Exam:   General:  Sleeping, wakes up to voice, confused  Cardiovascular: s1, s2, rrr  Respiratory: crackles at bases b/l  Abdomen: soft, nt, nd, bs+  Musculoskeletal: 1+ edema b/l   Data Reviewed: Basic Metabolic Panel:  Recent Labs Lab 11/20/14 2148 11/22/14 0554  NA 140 138  K 4.4 4.2  CL 106 106  CO2 27 28  GLUCOSE 105* 96  BUN 26* 21*  CREATININE 1.54* 1.37*  CALCIUM 8.4* 8.2*   Liver Function Tests: No results for input(s): AST, ALT, ALKPHOS, BILITOT, PROT, ALBUMIN in the last 168 hours. No results for input(s): LIPASE, AMYLASE in the last 168 hours. No results for input(s): AMMONIA in the last 168 hours. CBC:  Recent Labs Lab 11/20/14 2148  WBC 5.5  NEUTROABS 3.2  HGB 12.5*  HCT 38.8*  MCV 89.4  PLT 235   Cardiac Enzymes:  Recent Labs Lab 11/20/14 2148 11/21/14 0042 11/21/14 0638 11/21/14 1230  TROPONINI 0.04* 0.04* 0.03 0.03   BNP (last 3 results)  Recent Labs  11/20/14 2148  BNP 1133.0*    ProBNP (last 3 results) No results for input(s): PROBNP in the last 8760 hours.  CBG: No results for input(s): GLUCAP in  the last 168 hours.  No results found for this or any previous visit (from the past 240 hour(s)).   Studies: Dg Chest Port 1 View  11/20/2014   CLINICAL DATA:  Shortness of breath for a couple hours, generalized chest pain in past 15 minutes, history CHF, dementia, renal disease  EXAM: PORTABLE CHEST - 1 VIEW  COMPARISON:  Portable exam 2144 hours without  priors for comparison  FINDINGS: Enlargement of cardiac silhouette with pulmonary vascular congestion.  Mediastinal contours normal.  Perihilar infiltrates compatible with pulmonary edema and CHF.  No segmental consolidation, pleural effusion or pneumothorax.  Bones demineralized.  IMPRESSION: CHF.   Electronically Signed   By: Ulyses SouthwardMark  Boles M.D.   On: 11/20/2014 22:17    Scheduled Meds: . aspirin EC  81 mg Oral Daily  . carvedilol  3.125 mg Oral BID WC  . enoxaparin (LOVENOX) injection  40 mg Subcutaneous Q24H  . furosemide  40 mg Intravenous Daily  . LORazepam  0.25 mg Intravenous Once  . memantine  10 mg Oral BID  . mirtazapine  45 mg Oral QHS  . polyethylene glycol  17 g Oral Daily  . risperiDONE  2 mg Oral BID  . sodium chloride  3 mL Intravenous Q12H  . traZODone  100 mg Oral QHS   Continuous Infusions:   Principal Problem:   CHF exacerbation Active Problems:   Alzheimer's dementia   CKD (chronic kidney disease)   SOB (shortness of breath)   Elevated troponin   Acute on chronic systolic heart failure   Acute respiratory failure with hypoxia    Time spent: 30mins    Kidd,John  [facility] Pager [phone]. If 7PM-7AM, please contact night-coverage at [email or web address], password [email or web address] 11/22/2014, 12:45 PM  LOS: 2 days

## 2014-11-22 NOTE — Progress Notes (Signed)
Notified MD, telemetry notified nurse pt has been running sinus tach. Notified first shift nurse. Awaiting response from MD.

## 2014-11-22 NOTE — Progress Notes (Signed)
Monitored pt every two hours and checked restraints. Toileted pt (condom catheter) and offered fluids. Pt was asleep the rest of the night. Will notify first shift nurse of pt's mental status change.

## 2014-11-23 LAB — BASIC METABOLIC PANEL WITH GFR
Anion gap: 8 (ref 5–15)
BUN: 20 mg/dL (ref 6–20)
CO2: 27 mmol/L (ref 22–32)
Calcium: 8.2 mg/dL — ABNORMAL LOW (ref 8.9–10.3)
Chloride: 104 mmol/L (ref 101–111)
Creatinine, Ser: 1.3 mg/dL — ABNORMAL HIGH (ref 0.61–1.24)
GFR calc Af Amer: 56 mL/min — ABNORMAL LOW (ref >60)
GFR calc non Af Amer: 48 mL/min — ABNORMAL LOW (ref >60)
Glucose, Bld: 79 mg/dL (ref 65–99)
Potassium: 4.1 mmol/L (ref 3.5–5.1)
Sodium: 139 mmol/L (ref 135–145)

## 2014-11-23 MED ORDER — HALOPERIDOL LACTATE 5 MG/ML IJ SOLN
2.0000 mg | Freq: Once | INTRAMUSCULAR | Status: AC
  Administered 2014-11-23: 2 mg via INTRAVENOUS
  Filled 2014-11-23: qty 1

## 2014-11-23 NOTE — Progress Notes (Signed)
Pharmacist Heart Failure Core Measure Documentation  Assessment: John Kidd has an EF documented as 20-25%  by echo.  Rationale: Heart failure patients with left ventricular systolic dysfunction (LVSD) and an EF < 40% should be prescribed an angiotensin converting enzyme inhibitor (ACEI) or angiotensin receptor blocker (ARB) at discharge unless a contraindication is documented in the medical record.  This patient is not currently on an ACEI or ARB for HF.  This note is being placed in the record in order to provide documentation that a contraindication to the use of these agents is present for this encounter.  ACE Inhibitor or Angiotensin Receptor Blocker is contraindicated (specify all that apply)  []   ACEI allergy AND ARB allergy []   Angioedema []   Moderate or severe aortic stenosis []   Hyperkalemia []   Hypotension []   Renal artery stenosis [x]   Worsening renal function, preexisting renal disease or dysfunction   [name], [name] 11/23/2014 11:57 AM

## 2014-11-23 NOTE — Progress Notes (Signed)
[facility] PROGRESS NOTE  John Kidd EAV:[id number] DOB: January 23, 1927 DOA: 11/20/2014 PCP: No primary care provider on file.  Assessment/Plan: 1. Acute respiratory failure with hypoxia. Related to congestive heart failure. Improving. Continue to wean down oxygen as tolerated. 2. Acute systolic congestive heart failure. Echocardiogram shows ejection fraction of 20-25%. Patient is on intravenous Lasix with good urine output. Continue current treatments. Patient's family is not aware of any other recent cardiac testing. Renal function appears to be improving. Will continue current treatments. 3. Acute renal failure. Likely related to the low output. Improving with diuresis. Continue to follow. 4. Advanced Alzheimer's dementia. He is a resident of a memory care unit.  Code Status: DNR Family Communication: discussed with patient's son and his brother at the bedside Disposition Plan: discharge to [facility] when adequately diuresed   Consultants:    Procedures:  Echo: - Left ventricle: The cavity size was normal. Wall thickness was normal. Systolic function was severely reduced. The estimated ejection fraction was in the range of 20% to 25%. Diastolic function is abnormal, indeterminate grade. Diffuse hypokinesis. - Aortic valve: Moderately calcified annulus. Trileaflet; moderately thickened leaflets. Valve area (VTI): 2.34 cm^2. Valve area (Vmax): 2.65 cm^2. - Mitral valve: Mildly calcified annulus. Mildly thickened leaflets . There was mild regurgitation. The MR vena contracta is 0.3 cm. - Left atrium: The atrium was mildly dilated. - Right ventricle: The cavity size was mildly dilated. - Tricuspid valve: There was moderate regurgitation. - Pulmonary arteries: Systolic pressure was moderately increased. PA peak pressure: 50 mm Hg (S). - Inferior vena cava: The vessel was dilated. The respirophasic diameter changes were blunted (< 50%), consistent with  elevated central venous pressure.  Antibiotics:    HPI/Subjective: Sleeping, wakes up to voice, confused  Objective: Filed Vitals:   11/23/14 0616  BP: 103/69  Pulse: 82  Temp: 97.5 F (36.4 C)  Resp: 20    Intake/Output Summary (Last 24 hours) at 11/23/14 1252 Last data filed at 11/23/14 1125  Gross per 24 hour  Intake    540 ml  Output   2350 ml  Net  -1810 ml   Filed Weights   11/21/14 0700 11/22/14 0648 11/23/14 0500  Weight: 89.8 kg (197 lb 15.6 oz) 88.8 kg (195 lb 12.3 oz) 86.9 kg (191 lb 9.3 oz)    Exam:   General:  NAD  Cardiovascular: s1, s2,regular  Respiratory: clear bilaterally  Abdomen: soft, nt, nd, bs+  Musculoskeletal: 1+ edema b/l   Data Reviewed: Basic Metabolic Panel:  Recent Labs Lab 11/20/14 2148 11/22/14 0554 11/23/14 0613  NA 140 138 139  K 4.4 4.2 4.1  CL 106 106 104  CO2 GLUCOSE 105* 96 79  BUN 26* 21* 20  CREATININE 1.54* 1.37* 1.30*  CALCIUM 8.4* 8.2* 8.2*   Liver Function Tests: No results for input(s): AST, ALT, ALKPHOS, BILITOT, PROT, ALBUMIN in the last 168 hours. No results for input(s): LIPASE, AMYLASE in the last 168 hours. No results for input(s): AMMONIA in the last 168 hours. CBC:  Recent Labs Lab 11/20/14 2148  WBC 5.5  NEUTROABS 3.2  HGB 12.5*  HCT 38.8*  MCV 89.4  PLT 235   Cardiac Enzymes:  Recent Labs Lab 11/20/14 2148 11/21/14 0042 11/21/14 0638 11/21/14 1230  TROPONINI 0.04* 0.04* 0.03 0.03   BNP (last 3 results)  Recent Labs  11/20/14 2148  BNP 1133.0*    ProBNP (last 3 results) No results for input(s): PROBNP in the last 8760 hours.  CBG: No results for input(s): GLUCAP in the last 168 hours.  No results found for this or any previous visit (from the past 240 hour(s)).   Studies: No results found.  Scheduled Meds: . aspirin EC  81 mg Oral Daily  . carvedilol  3.125 mg Oral BID WC  . enoxaparin (LOVENOX) injection  40 mg Subcutaneous Q24H  .  furosemide  40 mg Intravenous Daily  . LORazepam  0.25 mg Intravenous Once  . memantine  10 mg Oral BID  . mirtazapine  45 mg Oral QHS  . polyethylene glycol  17 g Oral Daily  . risperiDONE  2 mg Oral BID  . sodium chloride  3 mL Intravenous Q12H  . traZODone  100 mg Oral QHS   Continuous Infusions:   Principal Problem:   CHF exacerbation Active Problems:   Alzheimer's dementia   CKD (chronic kidney disease)   SOB (shortness of breath)   Elevated troponin   Acute on chronic systolic heart failure   Acute respiratory failure with hypoxia    Time spent: 30mins    MEMON,JEHANZEB  [facility] Pager [phone]. If 7PM-7AM, please contact night-coverage at [email or web address], password [email or web address] 11/23/2014, 12:52 PM  LOS: 3 days

## 2014-11-24 ENCOUNTER — Inpatient Hospital Stay (HOSPITAL_COMMUNITY): Payer: Medicare Other

## 2014-11-24 LAB — BASIC METABOLIC PANEL
ANION GAP: 6 (ref 5–15)
BUN: 22 mg/dL — ABNORMAL HIGH (ref 6–20)
CO2: 30 mmol/L (ref 22–32)
CREATININE: 1.41 mg/dL — AB (ref 0.61–1.24)
Calcium: 8.3 mg/dL — ABNORMAL LOW (ref 8.9–10.3)
Chloride: 101 mmol/L (ref 101–111)
GFR calc Af Amer: 50 mL/min — ABNORMAL LOW (ref >60)
GFR, EST NON AFRICAN AMERICAN: 44 mL/min — AB (ref >60)
GLUCOSE: 107 mg/dL — AB (ref 65–99)
Potassium: 4.6 mmol/L (ref 3.5–5.1)
Sodium: 137 mmol/L (ref 135–145)

## 2014-11-24 MED ORDER — LORAZEPAM 1 MG PO TABS
1.0000 mg | ORAL_TABLET | Freq: Two times a day (BID) | ORAL | Status: DC | PRN
Start: 2014-11-24 — End: 2014-11-27
  Administered 2014-11-24 – 2014-11-26 (×2): 1 mg via ORAL
  Filled 2014-11-24 (×3): qty 1

## 2014-11-24 NOTE — Progress Notes (Signed)
[facility] PROGRESS NOTE  John Kidd ZOX:[id number] DOB: Sep 05, 1926 DOA: 11/20/2014 PCP: No primary care provider on file.  Assessment/Plan: 1. Acute respiratory failure with hypoxia. Related to congestive heart failure. Improving. Continue to wean down oxygen as tolerated. 2. Acute systolic congestive heart failure. Echocardiogram shows ejection fraction of 20-25%. Patient is on intravenous Lasix with good urine output. Continue current treatments. Patient's family is not aware of any other recent cardiac testing. Weight is down 13lbs since admssion. Chest xray shows persistent volume overload and he is still requiring a fair amount of oxygen. Would not increase diuretic dosing with soft blood pressure. Will continue current treatments. 3. Acute renal failure. Likely related to the low output. Improving with diuresis. Continue to follow. 4. Advanced Alzheimer's dementia. He is a resident of a memory care unit. 5. Bradycardia. Reported by staff that patient was bradycardic and hypotensive. Will check ekg  Code Status: DNR Family Communication: discussed with patient's son at the bedside Disposition Plan: discharge to [facility] when adequately diuresed   Consultants:    Procedures:  Echo: - Left ventricle: The cavity size was normal. Wall thickness was normal. Systolic function was severely reduced. The estimated ejection fraction was in the range of 20% to 25%. Diastolic function is abnormal, indeterminate grade. Diffuse hypokinesis. - Aortic valve: Moderately calcified annulus. Trileaflet; moderately thickened leaflets. Valve area (VTI): 2.34 cm^2. Valve area (Vmax): 2.65 cm^2. - Mitral valve: Mildly calcified annulus. Mildly thickened leaflets . There was mild regurgitation. The MR vena contracta is 0.3 cm. - Left atrium: The atrium was mildly dilated. - Right ventricle: The cavity size was mildly dilated. - Tricuspid valve: There was moderate  regurgitation. - Pulmonary arteries: Systolic pressure was moderately increased. PA peak pressure: 50 mm Hg (S). - Inferior vena cava: The vessel was dilated. The respirophasic diameter changes were blunted (< 50%), consistent with elevated central venous pressure.  Antibiotics:    HPI/Subjective: Sitting up in bed, eating lunch, no distress. Reported by staff that he was hypotensive and bradycardic today  Objective: Filed Vitals:   11/24/14 1115  BP: 84/48  Pulse: 36  Temp:   Resp:     Intake/Output Summary (Last 24 hours) at 11/24/14 1214 Last data filed at 11/24/14 1010  Gross per 24 hour  Intake    960 ml  Output    800 ml  Net    160 ml   Filed Weights   11/22/14 0648 11/23/14 0500 11/24/14 0544  Weight: 88.8 kg (195 lb 12.3 oz) 86.9 kg (191 lb 9.3 oz) 86.8 kg (191 lb 5.8 oz)    Exam:   General:  NAD  Cardiovascular: s1, s2, irregular  Respiratory: poor inspiratory effort, crackles at bases  Abdomen: soft, nt, nd, bs+  Musculoskeletal: trace edema b/l   Data Reviewed: Basic Metabolic Panel:  Recent Labs Lab 11/20/14 2148 11/22/14 0554 11/23/14 0613 11/24/14 0557  NA 140 138 139 137  K 4.4 4.2 4.1 4.6  CL 106 106 104 101  CO2 GLUCOSE 105* 96 79 107*  BUN 26* 21* 20 22*  CREATININE 1.54* 1.37* 1.30* 1.41*  CALCIUM 8.4* 8.2* 8.2* 8.3*   Liver Function Tests: No results for input(s): AST, ALT, ALKPHOS, BILITOT, PROT, ALBUMIN in the last 168 hours. No results for input(s): LIPASE, AMYLASE in the last 168 hours. No results for input(s): AMMONIA in the last 168 hours. CBC:  Recent Labs Lab 11/20/14 2148  WBC 5.5  NEUTROABS 3.2  HGB 12.5*  HCT 38.8*  MCV 89.4  PLT 235   Cardiac Enzymes:  Recent Labs Lab 11/20/14 2148 11/21/14 0042 11/21/14 0638 11/21/14 1230  TROPONINI 0.04* 0.04* 0.03 0.03   BNP (last 3 results)  Recent Labs  11/20/14 2148  BNP 1133.0*    ProBNP (last 3 results) No results for  input(s): PROBNP in the last 8760 hours.  CBG: No results for input(s): GLUCAP in the last 168 hours.  No results found for this or any previous visit (from the past 240 hour(s)).   Studies: No results found.  Scheduled Meds: . aspirin EC  81 mg Oral Daily  . carvedilol  3.125 mg Oral BID WC  . enoxaparin (LOVENOX) injection  40 mg Subcutaneous Q24H  . LORazepam  0.25 mg Intravenous Once  . memantine  10 mg Oral BID  . mirtazapine  45 mg Oral QHS  . polyethylene glycol  17 g Oral Daily  . risperiDONE  2 mg Oral BID  . sodium chloride  3 mL Intravenous Q12H  . traZODone  100 mg Oral QHS   Continuous Infusions:   Principal Problem:   CHF exacerbation Active Problems:   Alzheimer's dementia   CKD (chronic kidney disease)   SOB (shortness of breath)   Elevated troponin   Acute on chronic systolic heart failure   Acute respiratory failure with hypoxia    Time spent: 30mins    MEMON,JEHANZEB  [facility] Pager [phone]. If 7PM-7AM, please contact night-coverage at [email or web address], password [email or web address] 11/24/2014, 12:14 PM  LOS: 4 days

## 2014-11-24 NOTE — Plan of Care (Signed)
Patient has been out of restraints all day but now is becoming agitative and fidgety...insistant about climbing out of bed and pulling on lines. Patient placed back in restraints to promote safety - sitter still in place (but will be watching 2 rooms at 1900.) Ativan 0.5 was given - but did not seem to help. Dr. Truitt MerleNotified and additional prn meds ordered.

## 2014-11-24 NOTE — Progress Notes (Addendum)
PT Cancellation Note  Patient Details Name: John Kidd MRN: [id number] DOB: 11/15/1926   Cancelled Treatment:    Reason Eval/Treat Not Completed: Medical issues which prohibited therapy.  Pt was found supine in bed with very labored breathing.  Supplemental O2 was set at 5 L /min with O2 sat=100% but HR=37, BP= 89/46.  Pt was very drowsy but would awaken.  RN and RT alerted.  RN to contact Dr. Kerry HoughMemon.  I will defer PT eval until medical stability is ensured.   [name], [name]  PT 11/24/2014, 11:45 AM

## 2014-11-24 NOTE — Plan of Care (Signed)
BP 84/48 and HR 36. Slight diff breathing but SPO2 96. Other than that asympotomatic.  Dr. Truitt MerleNotified. PT also not comfortable to attempt ambulation D/T current HR and BP fluctuations.

## 2014-11-25 LAB — BASIC METABOLIC PANEL
Anion gap: 4 — ABNORMAL LOW (ref 5–15)
BUN: 22 mg/dL — ABNORMAL HIGH (ref 6–20)
CALCIUM: 8 mg/dL — AB (ref 8.9–10.3)
CHLORIDE: 104 mmol/L (ref 101–111)
CO2: 32 mmol/L (ref 22–32)
Creatinine, Ser: 1.32 mg/dL — ABNORMAL HIGH (ref 0.61–1.24)
GFR calc Af Amer: 55 mL/min — ABNORMAL LOW (ref >60)
GFR, EST NON AFRICAN AMERICAN: 47 mL/min — AB (ref >60)
GLUCOSE: 107 mg/dL — AB (ref 65–99)
POTASSIUM: 4.3 mmol/L (ref 3.5–5.1)
Sodium: 140 mmol/L (ref 135–145)

## 2014-11-25 MED ORDER — DIPHENHYDRAMINE HCL 12.5 MG/5ML PO ELIX: 12.5000 mg | ORAL_SOLUTION | Freq: Three times a day (TID) | ORAL | Status: DC | PRN

## 2014-11-25 MED ORDER — DIPHENHYDRAMINE HCL 50 MG/ML IJ SOLN
12.5000 mg | Freq: Once | INTRAMUSCULAR | Status: AC
  Administered 2014-11-25: 12.5 mg via INTRAVENOUS
  Filled 2014-11-25: qty 1

## 2014-11-25 MED ORDER — FUROSEMIDE 10 MG/ML IJ SOLN
20.0000 mg | Freq: Two times a day (BID) | INTRAMUSCULAR | Status: DC
  Administered 2014-11-25 – 2014-11-27 (×4): 20 mg via INTRAVENOUS
  Filled 2014-11-25 (×4): qty 2

## 2014-11-25 NOTE — Care Management Note (Signed)
Case Management Note  Patient Details  Name: Rolinda RoanLassie Mcshan MRN: [id number] Date of Birth: 06/25/1927  Subjective/Objective:                    Action/Plan:   Expected Discharge Date:                  Expected Discharge Plan:  Assisted Living / Rest Home  In-House Referral:  Clinical Social Work  Discharge planning Services  CM Consult  Post Acute Care Choice:  NA Choice offered to:  NA  DME Arranged:    DME Agency:     HH Arranged:    HH Agency:     Status of Service:  Completed, signed off  Medicare Important Message Given:  Yes Date Medicare IM Given:  11/21/14 Medicare IM give by:  Kathyrn SheriffJessica Childress, RN, MSN, CM  Date Additional Medicare IM Given:    Additional Medicare Important Message give by:     If discussed at Long Length of Stay Meetings, dates discussed:  11/25/14  Additional Comments:  Cheryl FlashBlackwell, Kalief Kattner Crowder, RN 11/25/2014, 3:59 PM

## 2014-11-25 NOTE — Evaluation (Signed)
Physical Therapy Evaluation Patient Details Name: John Kidd MRN: [id number] DOB: 06-09-1927 Today's Date: 11/25/2014   History of Present Illness  Pt is an 81 year old male admitted with acute respiratory failure related to CHF.  He is a resident of [facility] on the memory care unit.  Clinical Impression   Pt was seen for evaluation.  He was able to awaken and was very cooperative, not agitated at all.  His O2 sat on 4 L O2= 100%, HR=98, BP=109/68.  He needed min assist to sit at edge of bed, mod assist to stand.  He ambulated 44' with 2 person hand held assist.  He appears to be generally weak and deconditioned from his probable baseline but should transition to [facility] with HHPT.  His O2 sat after gait on 4 L O2= 98%.  O2 was decreased to 3 L/min and after 30 min his O2 sat= 100%.    Follow Up Recommendations Home health PT    Equipment Recommendations  None recommended by PT    Recommendations for Other Services   none    Precautions / Restrictions Precautions Precautions: Fall Restrictions Weight Bearing Restrictions: No      Mobility  Bed Mobility Overal bed mobility: Needs Assistance Bed Mobility: Supine to Sit     Supine to sit: Min assist;HOB elevated        Transfers Overall transfer level: Needs assistance Equipment used: None Transfers: Sit to/from Stand Sit to Stand: Mod assist            Ambulation/Gait Ambulation/Gait assistance: Min assist Ambulation Distance (Feet): 75 Feet Assistive device: 2 person hand held assist Gait Pattern/deviations: Shuffle   Gait velocity interpretation: Below normal speed for age/gender                     Balance Overall balance assessment: Needs assistance Sitting-balance support: No upper extremity supported;Feet supported Sitting balance-Leahy Scale: Good     Standing balance support: No upper extremity supported Standing balance-Leahy Scale: Fair                                Pertinent Vitals/Pain Pain Assessment: No/denies pain    Home Living Family/patient expects to be discharged to:: Assisted living                 Additional Comments: unknown    Prior Function Level of Independence: Needs assistance   Gait / Transfers Assistance Needed: per report, pt ambulates independently  ADL's / Homemaking Assistance Needed: I assume that he needs assist with bathing and dressing due to dementia        Hand Dominance        Extremity/Trunk Assessment               Lower Extremity Assessment: Generalized weakness      Cervical / Trunk Assessment: Kyphotic  Communication   Communication: No difficulties  Cognition Arousal/Alertness: Lethargic Behavior During Therapy: WFL for tasks assessed/performed Overall Cognitive Status: History of cognitive impairments - at baseline                                    Assessment/Plan    PT Assessment All further PT needs can be met in the next venue of care  PT Diagnosis Generalized weakness   PT Problem List Decreased strength;Decreased activity tolerance;Decreased mobility;Cardiopulmonary status  limiting activity;Decreased cognition  PT Treatment Interventions     PT Goals (Current goals can be found in the Care Plan section) Acute Rehab PT Goals PT Goal Formulation: All assessment and education complete, DC therapy         Barriers to discharge  none                     End of Session Equipment Utilized During Treatment: Gait belt Activity Tolerance: Patient tolerated treatment well Patient left: in chair;with call bell/phone within reach;with nursing/sitter in room Nurse Communication: Mobility status         Time: 1572-6203 PT Time Calculation (min) (ACUTE ONLY): 28 min   Charges:   PT Evaluation $Initial PT Evaluation Tier I: 1 Procedure     PT G CodesDemetrios Isaacs L 11/25/2014, 11:16 AM

## 2014-11-25 NOTE — Progress Notes (Signed)
Pt calm and cooperative, resting in bed, restraints removed again for trial. Will continue to monitor pt.

## 2014-11-25 NOTE — Progress Notes (Signed)
0800- removed pt restraints for trial.  Pt up in chair per PT.    1300- pt became agitated, pulling at equipment and lines, wrist restraints reapplied, will continue to monitor.

## 2014-11-25 NOTE — Clinical Social Work Note (Signed)
[facility] are aware of dysphagia 1 diet and that pt is requiring oxygen. Facility reports they can manage this. Discussed PT recommendation for home health and notified CM that facility requests [facility].   Derenda FennelKara Jacquel Redditt, LCSW [phone]

## 2014-11-25 NOTE — Progress Notes (Addendum)
[facility] PROGRESS NOTE  John Kidd UEA:[id number] DOB: 06/26/1927 DOA: 11/20/2014 PCP: No primary care provider on file.  Summary:  This is an 81 year old patient with advanced dementia who is a resident of a memory care unit. He presents to the hospital with progressive shortness of breath. He was found to be in acute congestive heart failure. Echocardiogram showed an ejection fraction of 20-25%. Patient has been placed on intravenous Lasix, diuresis has been challenging to borderline blood pressures. His weight is down 14 pounds since admission. He will be continued diuresis for now. Will need outpatient cardiology follow-up.  Assessment/Plan: 1. Acute respiratory failure with hypoxia. Related to congestive heart failure. Improving. Continue to wean down oxygen as tolerated. 2. Acute systolic congestive heart failure. Echocardiogram shows ejection fraction of 20-25%. Patient is on intravenous Lasix with good urine output. Continue current treatments. Weight is down 14lbs since admssion. Chest xray shows persistent volume overload and he is still requiring a fair amount of oxygen. Blood pressures are marginal. Continue low dose intravenous lasix. Continue low dose coreg. Not a candidate for ACE due to low blood pressure and renal dysfunction. He will need outpatient cardiology follow up. 3. Acute renal failure. Likely related to the low cardiac output. Improving with diuresis. Urine output has been fair. Continue to follow. 4. Advanced Alzheimer's dementia. He is a resident of a memory care unit. Currently he is requiring mechanical restraints due to confusion/agitation. I expect this to improve once he can return to a familiar environment   Code Status: DNR Family Communication: discussed with patient's son at the bedside Disposition Plan: discharge to [facility] when adequately diuresed   Consultants:    Procedures:  Echo: - Left ventricle: The cavity size was normal. Wall  thickness was normal. Systolic function was severely reduced. The estimated ejection fraction was in the range of 20% to 25%. Diastolic function is abnormal, indeterminate grade. Diffuse hypokinesis. - Aortic valve: Moderately calcified annulus. Trileaflet; moderately thickened leaflets. Valve area (VTI): 2.34 cm^2. Valve area (Vmax): 2.65 cm^2. - Mitral valve: Mildly calcified annulus. Mildly thickened leaflets . There was mild regurgitation. The MR vena contracta is 0.3 cm. - Left atrium: The atrium was mildly dilated. - Right ventricle: The cavity size was mildly dilated. - Tricuspid valve: There was moderate regurgitation. - Pulmonary arteries: Systolic pressure was moderately increased. PA peak pressure: 50 mm Hg (S). - Inferior vena cava: The vessel was dilated. The respirophasic diameter changes were blunted (< 50%), consistent with elevated central venous pressure.  Antibiotics:    HPI/Subjective: Sitting in bed, says breathing is "rough". Limited history due to dementia  Objective: Filed Vitals:   11/25/14 1619  BP: 97/71  Pulse: 56  Temp:   Resp: 16    Intake/Output Summary (Last 24 hours) at 11/25/14 1728 Last data filed at 11/25/14 1555  Gross per 24 hour  Intake    603 ml  Output   2000 ml  Net  -1397 ml   Filed Weights   11/23/14 0500 11/24/14 0544 11/25/14 0634  Weight: 86.9 kg (191 lb 9.3 oz) 86.8 kg (191 lb 5.8 oz) 86.5 kg (190 lb 11.2 oz)    Exam:   General:  Breathing appears mildly labored  Cardiovascular: s1, s2, irregular  Respiratory: poor inspiratory effort, crackles at bases  Abdomen: soft, nt, nd, bs+  Musculoskeletal: trace edema b/l   Data Reviewed: Basic Metabolic Panel:  Recent Labs Lab 11/20/14 2148 11/22/14 0554 11/23/14 [id number] 11/24/14 0557 11/25/14 0628  NA 140  138 139 137 140  K 4.4 4.2 4.1 4.6 4.3  CL 106 106 104 101 104  CO2 32  GLUCOSE 105* 96 79 107* 107*  BUN 26* 21* 20 22*  22*  CREATININE 1.54* 1.37* 1.30* 1.41* 1.32*  CALCIUM 8.4* 8.2* 8.2* 8.3* 8.0*   Liver Function Tests: No results for input(s): AST, ALT, ALKPHOS, BILITOT, PROT, ALBUMIN in the last 168 hours. No results for input(s): LIPASE, AMYLASE in the last 168 hours. No results for input(s): AMMONIA in the last 168 hours. CBC:  Recent Labs Lab 11/20/14 2148  WBC 5.5  NEUTROABS 3.2  HGB 12.5*  HCT 38.8*  MCV 89.4  PLT 235   Cardiac Enzymes:  Recent Labs Lab 11/20/14 2148 11/21/14 0042 11/21/14 0638 11/21/14 1230  TROPONINI 0.04* 0.04* 0.03 0.03   BNP (last 3 results)  Recent Labs  11/20/14 2148  BNP 1133.0*    ProBNP (last 3 results) No results for input(s): PROBNP in the last 8760 hours.  CBG: No results for input(s): GLUCAP in the last 168 hours.  No results found for this or any previous visit (from the past 240 hour(s)).   Studies: Dg Chest Port 1 View  11/24/2014   CLINICAL DATA:  Increased shortness of breath today.  EXAM: PORTABLE CHEST - 1 VIEW  COMPARISON:  11/20/2014  FINDINGS: The heart is enlarged. There are increased airspace filling opacities in the lungs bilaterally. Suspect increased density in the left lung base partially obscuring the hemidiaphragm. However the left lung base is difficult to evaluate given the technique.  IMPRESSION: Cardiomegaly in increased airspace filling opacities bilaterally, consistent with edema.  Possible increased infiltrate or focal edema left lung base.   Electronically Signed   By: Norva Pavlov M.D.   On: 11/24/2014 15:46    Scheduled Meds: . aspirin EC  81 mg Oral Daily  . carvedilol  3.125 mg Oral BID WC  . enoxaparin (LOVENOX) injection  40 mg Subcutaneous Q24H  . furosemide  20 mg Intravenous BID  . LORazepam  0.25 mg Intravenous Once  . memantine  10 mg Oral BID  . mirtazapine  45 mg Oral QHS  . polyethylene glycol  17 g Oral Daily  . risperiDONE  2 mg Oral BID  . sodium chloride  3 mL Intravenous Q12H  .  traZODone  100 mg Oral QHS   Continuous Infusions:   Principal Problem:   CHF exacerbation Active Problems:   Alzheimer's dementia   CKD (chronic kidney disease)   SOB (shortness of breath)   Elevated troponin   Acute on chronic systolic heart failure   Acute respiratory failure with hypoxia    Time spent:    Jariyah Hackley  [facility] Pager [phone]. If 7PM-7AM, please contact night-coverage at [email or web address], password [facility] 11/25/2014, 5:28 PM  LOS: 5 days

## 2014-11-26 ENCOUNTER — Inpatient Hospital Stay (HOSPITAL_COMMUNITY): Payer: Medicare Other

## 2014-11-26 DIAGNOSIS — N183 Chronic kidney disease, stage 3 (moderate): Secondary | ICD-10-CM

## 2014-11-26 LAB — BASIC METABOLIC PANEL
BUN: 23 mg/dL — ABNORMAL HIGH (ref 6–20)
CO2: 31 mmol/L (ref 22–32)
Calcium: 8.2 mg/dL — ABNORMAL LOW (ref 8.9–10.3)
Chloride: 106 mmol/L (ref 101–111)
Creatinine, Ser: 1.31 mg/dL — ABNORMAL HIGH (ref 0.61–1.24)
GFR calc Af Amer: 55 mL/min — ABNORMAL LOW (ref >60)
GFR calc non Af Amer: 48 mL/min — ABNORMAL LOW (ref >60)
Glucose, Bld: 111 mg/dL — ABNORMAL HIGH (ref 65–99)
Potassium: 4.5 mmol/L (ref 3.5–5.1)
Sodium: 139 mmol/L (ref 135–145)

## 2014-11-26 NOTE — Progress Notes (Signed)
[facility] PROGRESS NOTE  John Kidd ZOX:[id number] DOB: 07-Jun-1927 DOA: 11/20/2014 PCP: No primary care provider on file.    Code Status: DO NOT RESUSCITATE Family Communication: Family not available Disposition Plan: Discharge when clinically appropriate   Consultants:  None  Procedures:  2-D echocardiogram:Echo: - Left ventricle: The cavity size was normal. Wall thickness was normal. Systolic function was severely reduced. The estimated ejection fraction was in the range of 20% to 25%. Diastolic function is abnormal, indeterminate grade. Diffuse hypokinesis. - Aortic valve: Moderately calcified annulus. Trileaflet; moderately thickened leaflets. Valve area (VTI): 2.34 cm^2. Valve area (Vmax): 2.65 cm^2. - Mitral valve: Mildly calcified annulus. Mildly thickened leaflets . There was mild regurgitation. The MR vena contracta is 0.3 cm. - Left atrium: The atrium was mildly dilated. - Right ventricle: The cavity size was mildly dilated. - Tricuspid valve: There was moderate regurgitation. - Pulmonary arteries: Systolic pressure was moderately increased. PA peak pressure: 50 mm Hg (S). - Inferior vena cava: The vessel was dilated. The respirophasic diameter changes were blunted (< 50%), consistent with elevated central venous pressure.  Antibiotics:  None  HPI/Subjective: The patient is confused and demented and is trying to take his mittens off of his hands.  Objective: Filed Vitals:   11/26/14 1358  BP: 106/61  Pulse: 31  Temp: 98.1 F (36.7 C)  Resp: 16   oxygen saturation 100% on 2 L of nasal cannula oxygen.  Intake/Output Summary (Last 24 hours) at 11/26/14 1752 Last data filed at 11/26/14 1635  Gross per 24 hour  Intake    960 ml  Output    100 ml  Net    860 ml   Filed Weights   11/24/14 0544 11/25/14 0634 11/26/14 0552  Weight: 86.8 kg (191 lb 5.8 oz) 86.5 kg (190 lb 11.2 oz) 83.2 kg (183 lb 6.8 oz)     Exam:   General:  Large framed 81 year old African-American man who is mildly agitated and is trying to get up and take the mittens off of his hands.  Cardiovascular: Distant S1, S2.  Respiratory: Breathing mildly labored, likely from exhaustion from trying to get up. Coarse breath sounds anteriorly without significant audible crackles or wheezes.  Abdomen: Obese, positive bowel sounds, soft, mildly tender left lower quadrant; no appreciable distention.  Musculoskeletal/extremities: No acute hot red joints. No pedal edema.  Neurologic: He is confused and slightly agitated. He follows mild commands. His speech is non-intelligible.   Data Reviewed: Basic Metabolic Panel:  Recent Labs Lab 11/22/14 0554 11/23/14 8119 11/24/14 0557 11/25/14 0628 11/26/14 0646  NA 138 139 137 140 139  K 4.2 4.1 4.6 4.3 4.5  CL 106 104 101 104 106  CO2 32 31  GLUCOSE 96 79 107* 107* 111*  BUN 21* 20 22* 22* 23*  CREATININE 1.37* 1.30* 1.41* 1.32* 1.31*  CALCIUM 8.2* 8.2* 8.3* 8.0* 8.2*   Liver Function Tests: No results for input(s): AST, ALT, ALKPHOS, BILITOT, PROT, ALBUMIN in the last 168 hours. No results for input(s): LIPASE, AMYLASE in the last 168 hours. No results for input(s): AMMONIA in the last 168 hours. CBC:  Recent Labs Lab 11/20/14 2148  WBC 5.5  NEUTROABS 3.2  HGB 12.5*  HCT 38.8*  MCV 89.4  PLT 235   Cardiac Enzymes:  Recent Labs Lab 11/20/14 2148 11/21/14 0042 11/21/14 0638 11/21/14 1230  TROPONINI 0.04* 0.04* 0.03 0.03   BNP (last 3 results)  Recent Labs  11/20/14 2148  BNP 1133.0*  ProBNP (last 3 results) No results for input(s): PROBNP in the last 8760 hours.  CBG: No results for input(s): GLUCAP in the last 168 hours.  No results found for this or any previous visit (from the past 240 hour(s)).   Studies: No results found.  Scheduled Meds: . aspirin EC  81 mg Oral Daily  . carvedilol  3.125 mg Oral BID WC  . enoxaparin  (LOVENOX) injection  40 mg Subcutaneous Q24H  . furosemide  20 mg Intravenous BID  . LORazepam  0.25 mg Intravenous Once  . memantine  10 mg Oral BID  . mirtazapine  45 mg Oral QHS  . polyethylene glycol  17 g Oral Daily  . risperiDONE  2 mg Oral BID  . sodium chloride  3 mL Intravenous Q12H  . traZODone  100 mg Oral QHS   Continuous Infusions:   Assessment and plan:  Principal Problem:   Acute on chronic systolic heart failure Active Problems:   Acute respiratory failure with hypoxia   Alzheimer's dementia   CKD (chronic kidney disease), stage III   Elevated troponin   1. Acute on chronic systolic heart failure. The patient's chest x-ray on admission revealed pulmonary edema consistent with congestive heart failure. His BNP was 1133. Echocardiogram was ordered and revealed an ejection fraction of 20-25%. He was started on IV Lasix. The dose had to be decreased because of low-normal blood pressures. Low-dose Coreg was added. He was deemed not to be a candidate for ACE inhibitor therapy due to low-normal blood pressures and renal dysfunction. He is diuresing and is -4.4 L so far. We'll check a follow-up chest x-ray and BNP. Will assess systolic function with a TSH and free T4.  Acute respiratory failure with hypoxia. The patient was started on supplemental oxygen. His oxygen saturation has progressively improved. We'll continue to monitor.  Mildly elevated troponin I. This was thought to be secondary to congestive heart failure. The troponin I normalized.  Alzheimer's dementia with behavioral disturbances. He is a resident of a memory care unit at a local nursing facility. He has had intermittent agitation and confusion requiring mechanical restraints and/or mittens. We'll continue Namenda, Risperdal, and trazodone. We'll add when necessary lorazepam.  Stage III-stage IV chronic kidney disease. His creatinine appears to be stable ranging from 1.54 on admission to  improvement to 1.3 with diuresis.  Mild left lower quadrant abdominal tenderness. We'll order a KUB for further evaluation.     Time spent: 25 minutes    [facility] Adventist HospitalFISHER,Dorin Stooksbury  [facility] Pager [phone]. If 7PM-7AM, please contact night-coverage at [email or web address], password [email or web address] 11/26/2014, 5:52 PM  LOS: 6 days

## 2014-11-27 LAB — BASIC METABOLIC PANEL
Anion gap: 6 (ref 5–15)
BUN: 25 mg/dL — ABNORMAL HIGH (ref 6–20)
CO2: 32 mmol/L (ref 22–32)
Calcium: 8.3 mg/dL — ABNORMAL LOW (ref 8.9–10.3)
Chloride: 101 mmol/L (ref 101–111)
Creatinine, Ser: 1.39 mg/dL — ABNORMAL HIGH (ref 0.61–1.24)
GFR calc Af Amer: 51 mL/min — ABNORMAL LOW (ref >60)
GFR calc non Af Amer: 44 mL/min — ABNORMAL LOW (ref >60)
GLUCOSE: 105 mg/dL — AB (ref 65–99)
POTASSIUM: 4.2 mmol/L (ref 3.5–5.1)
Sodium: 139 mmol/L (ref 135–145)

## 2014-11-27 LAB — T4, FREE: Free T4: 1.08 ng/dL (ref 0.61–1.12)

## 2014-11-27 LAB — CBC
HCT: 38.3 % — ABNORMAL LOW (ref 39.0–52.0)
Hemoglobin: 12 g/dL — ABNORMAL LOW (ref 13.0–17.0)
MCH: 28.1 pg (ref 26.0–34.0)
MCHC: 31.3 g/dL (ref 30.0–36.0)
MCV: 89.7 fL (ref 78.0–100.0)
Platelets: 251 10*3/uL (ref 150–400)
RBC: 4.27 MIL/uL (ref 4.22–5.81)
RDW: 13.3 % (ref 11.5–15.5)
WBC: 5.4 10*3/uL (ref 4.0–10.5)

## 2014-11-27 LAB — BRAIN NATRIURETIC PEPTIDE: B Natriuretic Peptide: 1435 pg/mL — ABNORMAL HIGH (ref 0.0–100.0)

## 2014-11-27 LAB — TSH: TSH: 1.245 u[IU]/mL (ref 0.350–4.500)

## 2014-11-27 MED ORDER — LORAZEPAM 0.5 MG PO TABS: 0.5000 mg | ORAL_TABLET | Freq: Two times a day (BID) | ORAL | Status: DC | PRN

## 2014-11-27 MED ORDER — LORAZEPAM 0.5 MG PO TABS
0.5000 mg | ORAL_TABLET | Freq: Two times a day (BID) | ORAL | Status: DC | PRN
  Administered 2014-11-27 – 2014-11-28 (×2): 0.5 mg via ORAL
  Filled 2014-11-27 (×2): qty 1

## 2014-11-27 MED ORDER — FUROSEMIDE 10 MG/ML IJ SOLN
20.0000 mg | Freq: Three times a day (TID) | INTRAMUSCULAR | Status: DC
  Administered 2014-11-27 – 2014-11-28 (×3): 20 mg via INTRAVENOUS
  Filled 2014-11-27 (×4): qty 2

## 2014-11-27 MED ORDER — LEVALBUTEROL HCL 0.63 MG/3ML IN NEBU
0.6300 mg | INHALATION_SOLUTION | Freq: Four times a day (QID) | RESPIRATORY_TRACT | Status: DC
  Administered 2014-11-27 (×2): 0.63 mg via RESPIRATORY_TRACT
  Filled 2014-11-27 (×2): qty 3

## 2014-11-27 MED ORDER — SENNOSIDES-DOCUSATE SODIUM 8.6-50 MG PO TABS
1.0000 | ORAL_TABLET | Freq: Every day | ORAL | Status: DC
Start: 2014-11-27 — End: 2014-11-28
  Administered 2014-11-27: 1 via ORAL
  Filled 2014-11-27: qty 1

## 2014-11-27 MED ORDER — LEVALBUTEROL HCL 0.63 MG/3ML IN NEBU
0.6300 mg | INHALATION_SOLUTION | Freq: Three times a day (TID) | RESPIRATORY_TRACT | Status: DC
  Administered 2014-11-28 (×2): 0.63 mg via RESPIRATORY_TRACT
  Filled 2014-11-27 (×2): qty 3

## 2014-11-27 NOTE — Progress Notes (Signed)
Patient up in the recliner during lunch tolerated well. Patient able to feed himself with little assistance. Patient ambulated around bed.Oxygen saturation 94% on room air.

## 2014-11-27 NOTE — Progress Notes (Signed)
[facility] PROGRESS NOTE  John Kidd WUJ:[id number] DOB: 01/03/1927 DOA: 11/20/2014 PCP: No primary care provider on file.    Code Status: DO NOT RESUSCITATE Family Communication: Family not available Disposition Plan: Discharge when clinically appropriate   Consultants:  None  Procedures:  2-D echocardiogram:Echo: - Left ventricle: The cavity size was normal. Wall thickness was normal. Systolic function was severely reduced. The estimated ejection fraction was in the range of 20% to 25%. Diastolic function is abnormal, indeterminate grade. Diffuse hypokinesis. - Aortic valve: Moderately calcified annulus. Trileaflet; moderately thickened leaflets. Valve area (VTI): 2.34 cm^2. Valve area (Vmax): 2.65 cm^2. - Mitral valve: Mildly calcified annulus. Mildly thickened leaflets . There was mild regurgitation. The MR vena contracta is 0.3 cm. - Left atrium: The atrium was mildly dilated. - Right ventricle: The cavity size was mildly dilated. - Tricuspid valve: There was moderate regurgitation. - Pulmonary arteries: Systolic pressure was moderately increased. PA peak pressure: 50 mm Hg (S). - Inferior vena cava: The vessel was dilated. The respirophasic diameter changes were blunted (< 50%), consistent with elevated central venous pressure.  Antibiotics:  None  HPI/Subjective: The patient is currently asleep and arousable to name. According to the safety sitter and nursing staff, the patient was ambulated to the bathroom with some assistance and he fed himself lunch independently.  Objective: Filed Vitals:   11/27/14 1333  BP: 115/76  Pulse: 76  Temp: 98.2 F (36.8 C)  Resp: 20   oxygen saturation 97% on room air.  Intake/Output Summary (Last 24 hours) at 11/27/14 1427 Last data filed at 11/27/14 1200  Gross per 24 hour  Intake    680 ml  Output      0 ml  Net    680 ml   Filed Weights   11/25/14 0634 11/26/14 0552 11/27/14 0500   Weight: 86.5 kg (190 lb 11.2 oz) 83.2 kg (183 lb 6.8 oz) 84.2 kg (185 lb 10 oz)    Exam:   General:  Large framed 81 year old African-American man who sitting up in bed, asleep and arousable.  Cardiovascular: Distant S1, S2.  Respiratory: Breathing mildly labored, decreased breath sounds in the bases; rare basilar crackles.  Abdomen: Obese, positive bowel sounds, soft, mildly tender left lower quadrant; no appreciable distention.  Musculoskeletal/extremities: No acute hot red joints. No pedal edema.  Neurologic: He is sleeping and arousable to provocation and name.   Data Reviewed: Basic Metabolic Panel:  Recent Labs Lab 11/23/14 [phone] 11/24/14 0557 11/25/14 0628 11/26/14 0646 11/27/14 0653  NA 139 137 140 139 139  K 4.1 4.6 4.3 4.5 4.2  CL 104 101 104 106 101  CO2 27 30 32 31 32  GLUCOSE 79 107* 107* 111* 105*  BUN 20 22* 22* 23* 25*  CREATININE 1.30* 1.41* 1.32* 1.31* 1.39*  CALCIUM 8.2* 8.3* 8.0* 8.2* 8.3*   Liver Function Tests: No results for input(s): AST, ALT, ALKPHOS, BILITOT, PROT, ALBUMIN in the last 168 hours. No results for input(s): LIPASE, AMYLASE in the last 168 hours. No results for input(s): AMMONIA in the last 168 hours. CBC:  Recent Labs Lab 11/20/14 2148 11/27/14 0653  WBC 5.5 5.4  NEUTROABS 3.2  --   HGB 12.5* 12.0*  HCT 38.8* 38.3*  MCV 89.4 89.7  PLT 235 251   Cardiac Enzymes:  Recent Labs Lab 11/20/14 2148 11/21/14 0042 11/21/14 0638 11/21/14 1230  TROPONINI 0.04* 0.04* 0.03 0.03   BNP (last 3 results)  Recent Labs  11/20/14 2148 11/27/14 0653  BNP  1133.0* 1435.0*    ProBNP (last 3 results) No results for input(s): PROBNP in the last 8760 hours.  CBG: No results for input(s): GLUCAP in the last 168 hours.  No results found for this or any previous visit (from the past 240 hour(s)).   Studies: Dg Chest 1 View  11/26/2014   CLINICAL DATA:  Shortness of breath and increased agitation. History of CHF.  EXAM: CHEST   1 VIEW  COMPARISON:  11/24/2014  FINDINGS: The cardiac silhouette remains enlarged. Perihilar and bibasilar lung opacities are similar to the prior study. Blunting of the right costophrenic angle may reflect a small pleural effusion. There is no pneumothorax.  IMPRESSION: Unchanged perihilar and bibasilar lung opacities compatible with edema.   Electronically Signed   By: Sebastian AcheAllen  Grady   On: 11/26/2014 19:19   Dg Abd 1 View  11/26/2014   CLINICAL DATA:  Left lower quadrant abdominal tenderness. Shortness of breath. Increased agitation.  EXAM: ABDOMEN - 1 VIEW  COMPARISON:  None.  FINDINGS: The far left lateral aspect of the abdomen was incompletely imaged due to patient body habitus. Gas and a small to moderate amount of stool are present throughout the colon and rectum. No dilated loops of bowel are seen to suggest obstruction. Surgical clips are present in the pelvis. No acute osseous abnormality is seen.  IMPRESSION: Nonobstructed bowel-gas pattern.   Electronically Signed   By: Sebastian AcheAllen  Grady   On: 11/26/2014 19:10    Scheduled Meds: . aspirin EC  81 mg Oral Daily  . carvedilol  3.125 mg Oral BID WC  . enoxaparin (LOVENOX) injection  40 mg Subcutaneous Q24H  . furosemide  20 mg Intravenous TID  . LORazepam  0.25 mg Intravenous Once  . memantine  10 mg Oral BID  . mirtazapine  45 mg Oral QHS  . polyethylene glycol  17 g Oral Daily  . risperiDONE  2 mg Oral BID  . sodium chloride  3 mL Intravenous Q12H  . traZODone  100 mg Oral QHS   Continuous Infusions:   Assessment and plan:  Principal Problem:   Acute on chronic systolic heart failure Active Problems:   Acute respiratory failure with hypoxia   Alzheimer's dementia   CKD (chronic kidney disease), stage III   Elevated troponin   1. Acute on chronic systolic heart failure. The patient's chest x-ray on admission revealed pulmonary edema consistent with congestive heart failure. His BNP was 1133. Echocardiogram was ordered and  revealed an ejection fraction of 20-25%. He was started on IV Lasix. The dose had to be decreased because of low-normal blood pressures. Low-dose Coreg was added. He was deemed not to be a candidate for ACE inhibitor therapy due to low-normal blood pressures and chronic kidney disease. He is diuresing, but is incontinent, therefore, he is likely diuresed more than 4 L recorded. His follow-up BNP is slightly more elevated at 1435. His follow-up chest x-ray is unchanged. His TSH and free T4 are within normal limits. -Will try to increase Lasix to 20 mg IV every 8 hours rather than every 12 hours. -Will add Xopenex nebulizer for a few wheezes auscultated on exam. I'm not sure if his baseline, but he is likely symptomatically improved after being in the hospital 1 week.; regardless of the findings on the chest x-ray. Will give him 1 more day of diuresis and plan on discharging him tomorrow.  Acute respiratory failure with hypoxia. The patient was started on supplemental oxygen. His oxygen saturation has progressively  improved. We'll continue to monitor.  Mildly elevated troponin I. This was thought to be secondary to congestive heart failure. The troponin I normalized.  Alzheimer's dementia with behavioral disturbances. He is a resident of a memory care unit at a local nursing facility. He has had intermittent agitation and confusion requiring mechanical restraints and/or mittens. He was restarted on Namenda, Risperdal, and trazodone. When necessary lorazepam was ordered. A safety sitter was ordered as well. Today was a good day as he was less agitated.  Stage III-stage IV chronic kidney disease. His creatinine appears to be stable ranging from 1.54 on admission to improvement to 1.3-1.4 with diuresis.  Mild left lower quadrant abdominal tenderness. A KUB was ordered for evaluation. It revealed a nonobstructive bowel gas pattern and small to moderate amount of stool present throughout the  colon and rectum. We'll continue MiraLAX. Will start Senokot-S daily at bedtime.     Time spent: 25 minutes    [facility]  [facility] Pager [phone]. If 7PM-7AM, please contact night-coverage at [email or web address], password [facility] 11/27/2014, 2:27 PM  LOS: 7 days

## 2014-11-27 NOTE — Care Management Note (Signed)
Case Management Note  Patient Details  Name: John RoanLassie Kidd MRN: [id number] Date of Birth: 09/25/1926  Expected Discharge Date:                  Expected Discharge Plan:  Assisted Living / Rest Home  In-House Referral:  Clinical Social Work  Discharge planning Services  CM Consult  Post Acute Care Choice:  NA Choice offered to:  NA  DME Arranged:    DME Agency:     HH Arranged:    HH Agency:     Status of Service:  Completed, signed off  Medicare Important Message Given:  Yes Date Medicare IM Given:  11/21/14 Medicare IM give by:  Kathyrn SheriffJessica Sreshta Cressler, RN, MSN, CM  Date Additional Medicare IM Given:  11/27/14 Additional Medicare Important Message give by:  Kathyrn SheriffJessica Martine Bleecker, RN, MSN, CM   If discussed at Long Length of Stay Meetings, dates discussed:  11/27/2014  Additional Comments:  [name], [name], RN 11/27/2014, 2:49 PM

## 2014-11-27 NOTE — Progress Notes (Signed)
Initial Nutrition Assessment    INTERVENTION:  Magic cup  NUTRITION DIAGNOSIS: Unplanned weight gain related to acute CHF as evidenced by   GOAL:  Patient will meet greater than or equal to 90% of their needs   MONITOR:  PO intake, Supplement acceptance, Weight trends  REASON FOR ASSESSMENT:  Malnutrition Screening Tool    ASSESSMENT: Pt feeds himself and is eating well per sitter. Meal intake 60-100% of recent meals. Magic cup with meals (290 kcal 9 gr protein). His wt has changed significantly since admission but is desirable given his diuretic therapy.  May be ready for discharge tomorrow per MD.  Height:  Ht Readings from Last 1 Encounters:  11/27/14 5\' 9"  (1.753 m)    Weight:  Wt Readings from Last 1 Encounters:  11/27/14 185 lb 10 oz (84.2 kg)    Ideal Body Weight:  73 kg  Wt Readings from Last 10 Encounters:  11/27/14 185 lb 10 oz (84.2 kg)  admission wt 204#  BMI:  Body mass index is 27.4 kg/(m^2). overiweight  Estimated Nutritional Needs:  Kcal:  1900-2100  Protein:  67 gr  Fluid:  1500 ml daily  Skin:   dry, flaky  Diet Order:  DIET - DYS 1 Room service appropriate?: Yes; Fluid consistency:: Thin  EDUCATION NEEDS:  No education needs identified at this time   Intake/Output Summary (Last 24 hours) at 11/27/14 1651 Last data filed at 11/27/14 1200  Gross per 24 hour  Intake    560 ml  Output      0 ml  Net    560 ml    Last BM:  unknown  Royann ShiversLynn Ivannah Zody MS,RD,CSG,LDN Office: [phone] Pager: [phone]

## 2014-11-28 LAB — BASIC METABOLIC PANEL
Anion gap: 10 (ref 5–15)
BUN: 27 mg/dL — ABNORMAL HIGH (ref 6–20)
CALCIUM: 8.7 mg/dL — AB (ref 8.9–10.3)
CHLORIDE: 98 mmol/L — AB (ref 101–111)
CO2: 31 mmol/L (ref 22–32)
Creatinine, Ser: 1.38 mg/dL — ABNORMAL HIGH (ref 0.61–1.24)
GFR calc Af Amer: 52 mL/min — ABNORMAL LOW (ref >60)
GFR calc non Af Amer: 45 mL/min — ABNORMAL LOW (ref >60)
Glucose, Bld: 130 mg/dL — ABNORMAL HIGH (ref 65–99)
POTASSIUM: 4.5 mmol/L (ref 3.5–5.1)
SODIUM: 139 mmol/L (ref 135–145)

## 2014-11-28 MED ORDER — CARVEDILOL 3.125 MG PO TABS
3.1250 mg | ORAL_TABLET | Freq: Two times a day (BID) | ORAL | Status: AC
Start: 2014-11-28 — End: ?

## 2014-11-28 MED ORDER — LORAZEPAM 0.5 MG PO TABS: 0.5000 mg | ORAL_TABLET | Freq: Two times a day (BID) | ORAL | Status: AC | PRN

## 2014-11-28 MED ORDER — ALBUTEROL SULFATE HFA 108 (90 BASE) MCG/ACT IN AERS: 2.0000 | INHALATION_SPRAY | Freq: Two times a day (BID) | RESPIRATORY_TRACT | Status: AC

## 2014-11-28 MED ORDER — FUROSEMIDE 40 MG PO TABS: 60.0000 mg | ORAL_TABLET | Freq: Every day | ORAL | Status: DC

## 2014-11-28 NOTE — Clinical Social Work Note (Signed)
Pt d/c today back to [facility]. CSW notified Evon at facility prior to 1:00 pm and she states that pt must be assessed prior to return as he has been out of the building for more than 3 days. CSW requested that this be done early this afternoon so that pt could d/c. She states that care coordinators are not in the building this afternoon so pt could not return. Evon verified this with someone else while CSW was on hold. CSW informed her that DSS would be called if facility could not take pt back today. She said that this was fine because it is a "rule" that facilities do this new assessment. CSW educated her that this was not a [place] law, but it may be their facility guideline and this should have been communicated to CSW when facility was called multiple times about pt updates. CSW notified [facility] of issue and they assured CSW that they would call facility and discuss. CSW received a call back from Hooperrystal, Charity fundraiserN at facility. After extensive discussion, they will accept pt back today. Crystal verified pt's ADLs with RN at [facility] and D/C summary and FL2 were faxed to [facility] for review. Pt's son, Joni FearsClinton is aware of d/c and will transport pt this afternoon.   Derenda FennelKara Teagen Mcleary, LCSW [phone]

## 2014-11-28 NOTE — Discharge Summary (Addendum)
Physician Discharge Summary  John Kidd NFA:[id number] DOB: Aug 15, 1926 DOA: 11/20/2014  PCP: No primary care provider on file.  Admit date: 11/20/2014 Discharge date: 11/28/2014  Time spent: Greater than 30 minutes  Recommendations for Outpatient Follow-up:  1. Recommend follow-up of the patient's serum potassium and renal function in 1 week. 2. Patient should be on a heart healthy low-sodium diet. 3. Home health physical therapy ordered prior to discharge. 4. The patient was discharged to [facility].    Discharge Diagnoses:  1. Acute on chronic systolic heart failure. -Per echocardiogram, EF was 20-25%. -Weight on admission was approximately 204 pounds. Weight at discharge was 180 pounds. 2. Acute respiratory failure with hypoxia, secondary to #1. Resolved. 3. Stage III chronic kidney disease with mild acute kidney injury secondary to CHF. -Creatinine at the time of discharge was 1.38. 4. Mildly elevated troponin I secondary to CHF. Resolved. 5. Alzheimer's dementia with behavioral disturbances/delirium. Delirium resolved.   Discharge Condition: Improved.  Diet recommendation: Heart healthy.  Filed Weights   11/26/14 0552 11/27/14 0500 11/28/14 0500  Weight: 83.2 kg (183 lb 6.8 oz) 84.2 kg (185 lb 10 oz) 81.5 kg (179 lb 10.8 oz)    History of present illness:  The patient is an 81 year old man with a history of systolic congestive heart failure, advanced dementia, and coronary artery disease, who presented to the emergency department from an assisted living facility-memory care unit, on 11/20/2014 with a complaint of shortness of breath. In the ED, he was afebrile and hemodynamically stable. His oxygen saturations were in the low 90s on supplemental oxygen. He had tachypnea. His chest x-ray revealed pulmonary edema consistent with congestive heart failure. His BNP was 1133. His troponin I was marginally elevated at 0.04. He was admitted for further evaluation and  management.  Hospital Course:  1. Acute on chronic systolic heart failure. The patient's chest x-ray on admission revealed pulmonary edema consistent with congestive heart failure. His BNP was 1133 on admission. He was started on IV Lasix. The dose had to be decreased because of low-normal blood pressures. He has some wheezes, so Xopenex nebulizer was given. Echocardiogram was ordered and revealed an ejection fraction of 20-25%. His TSH and free T4 were within normal limits. Low-dose Coreg was added. He was deemed not to be a candidate for ACE/ inhibitor/ARB therapy due to low-normal blood pressures and chronic kidney disease. Therefore, Cozaar was discontinued. A Foley catheter was not inserted, but a condom catheter was placed for strict I's and O's. Unfortunately, the patient did not keep it on consistently. He diuresed at least 4 L when the condom catheter was kept on, but he diuresed several more liters as the nursing staff reported large volumes of incontinent urine.Marland Kitchen His follow-up chest x-ray was unchanged, but he was clinically much improved. His TSH and free T4 are within normal limits. His weight was approximately 180 pounds at the time of discharge (approximately 204 pounds on admission). He was discharged on carvedilol and 60 mg of Lasix.  Acute respiratory failure with hypoxia. The patient was started on supplemental oxygen, for low-normal oxygen saturations. His oxygen saturation has progressively improved. It was 97% on room air at the time of discharge.  Mildly elevated troponin I. This was thought to be secondary to congestive heart failure. The troponin I did normalize.  Alzheimer's dementia with behavioral disturbances. He is a resident of a memory care unit at a local nursing facility. He  had intermittent agitation and confusion requiring mechanical restraints and/or mittens. He  was restarted on Namenda, Risperdal, and trazodone. When necessary lorazepam was ordered. A  safety sitter was ordered as well. The patient became less agitated and confused and slightly more oriented. At the time of discharge, he did not require restraints or hand mittens. He fed himself and was likely back to baseline.   Stage III-stage IV chronic kidney disease. The patient's baseline creatinine appears to be 1.3-1.4. Was slightly increased at 1.5 on admission. Cozaar was withheld. At the time of discharge, following diuresis, it was 1.38.  Mild left lower quadrant abdominal tenderness. A KUB was ordered for evaluation. It revealed a nonobstructive bowel gas pattern and small to moderate amount of stool present throughout the colon and rectum. Laxative therapy was restarted and continued.     Procedures:  2-D echocardiogram:Echo: - Left ventricle: The cavity size was normal. Wall thickness was normal. Systolic function was severely reduced. The estimated ejection fraction was in the range of 20% to 25%. Diastolic function is abnormal, indeterminate grade. Diffuse hypokinesis. - Aortic valve: Moderately calcified annulus. Trileaflet; moderately thickened leaflets. Valve area (VTI): 2.34 cm^2. Valve area (Vmax): 2.65 cm^2. - Mitral valve: Mildly calcified annulus. Mildly thickened leaflets . There was mild regurgitation. The MR vena contracta is 0.3 cm. - Left atrium: The atrium was mildly dilated. - Right ventricle: The cavity size was mildly dilated. - Tricuspid valve: There was moderate regurgitation. - Pulmonary arteries: Systolic pressure was moderately increased. PA peak pressure: 50 mm Hg (S). - Inferior vena cava: The vessel was dilated. The respirophasic diameter changes were blunted (< 50%), consistent with elevated central venous pressure.  Consultations:  None  Discharge Exam: Filed Vitals:   11/28/14 1043  BP: 118/86  Pulse: 97  Temp:   Resp:    temperature 98.8. Respiratory rate 20. Oxygen saturation on room air 97%.  General:  Large framed 81 year old African-American man who sitting up in bed and much more alert and less agitated.  Cardiovascular: Distant S1, S2.  Respiratory: Breathing mildly labored, decreased breath sounds in the bases; rare basilar crackles.  Abdomen: Obese, positive bowel sounds, soft, mildly tender left lower quadrant; no appreciable distention.  Musculoskeletal/extremities: No acute hot red joints. No pedal edema.  Neurologic: He is alert. His speech is clear. He follows directions. He has a mild tremor of his hands.  Discharge Instructions   Discharge Instructions    Diet - low sodium heart healthy    Complete by:  As directed      Discharge instructions    Complete by:  As directed   The patient should follow-up with his primary care provider at the nursing facility in one week. The patient should be given a low-salt-heart healthy diet.     Increase activity slowly    Complete by:  As directed           Current Discharge Medication List    START taking these medications   Details  albuterol (PROVENTIL HFA;VENTOLIN HFA) 108 (90 BASE) MCG/ACT inhaler Inhale 2 puffs into the lungs 2 (two) times daily. Qty: 1 Inhaler, Refills: 2    carvedilol (COREG) 3.125 MG tablet Take 1 tablet (3.125 mg total) by mouth 2 (two) times daily with a meal. Qty: 60 tablet, Refills: 6    furosemide (LASIX) 40 MG tablet Take 1.5 tablets (60 mg total) by mouth daily. Qty: 45 tablet, Refills: 6      CONTINUE these medications which have NOT CHANGED   Details  acetaminophen (TYLENOL) 500 MG tablet Take 500  mg by mouth every 4 (four) hours as needed (**To take every 4 hours as needed for 24 hours for fever 99.5 to 101. to monitor temperature three times daily until normal. If over 101, contact MD but not to exceed  in 24 hours).    alum & mag hydroxide-simeth (MAALOX/MYLANTA) 200-200-20 MG/5ML suspension Take 30 mLs by mouth daily as needed for indigestion or heartburn.    aspirin EC 81 MG  tablet Take 81 mg by mouth daily.    Cholecalciferol (VITAMIN D) 2000 UNITS CAPS Take 1 capsule by mouth daily.    docusate sodium (COLACE) 100 MG capsule Take 100 mg by mouth 2 (two) times daily.    guaiFENesin (ROBITUSSIN) 100 MG/5ML liquid Take 200 mg by mouth every 6 (six) hours as needed for cough.    loperamide (IMODIUM) 2 MG capsule Take 2 mg by mouth as needed for diarrhea or loose stools.    LORazepam 0.5 mg tablet  I Take 1 tablet by mouth 2 times daily as needed for agitation     magnesium hydroxide (MILK OF MAGNESIA) 400 MG/5ML suspension Take 30 mLs by mouth at bedtime as needed for mild constipation.    memantine (NAMENDA) 10 MG tablet Take 10 mg by mouth 2 (two) times daily.    mirtazapine (REMERON) 45 MG tablet Take 45 mg by mouth at bedtime.    risperiDONE (RISPERDAL) 2 MG tablet Take 2 mg by mouth 2 (two) times daily.    traZODone (DESYREL) 100 MG tablet Take 100 mg by mouth at bedtime.    polyethylene glycol (MIRALAX / GLYCOLAX) packet Take 17 g by mouth daily.      STOP taking these medications     losartan (COZAAR) 25 MG tablet      OLANZapine (ZYPREXA) 5 MG tablet        Allergies  Allergen Reactions  . Ace Inhibitors    Follow-up Information    Follow up On 12/05/2014.   Why:  follow-up appt with cardiology on friday june 3,2016 at 10:00      Follow up with [facility].   Contact information:   [address] [place] [address] [phone]        The results of significant diagnostics from this hospitalization (including imaging, microbiology, ancillary and laboratory) are listed below for reference.    Significant Diagnostic Studies: Dg Chest 1 View  11/26/2014   CLINICAL DATA:  Shortness of breath and increased agitation. History of CHF.  EXAM: CHEST  1 VIEW  COMPARISON:  11/24/2014  FINDINGS: The cardiac silhouette remains enlarged. Perihilar and bibasilar lung opacities are similar to the prior study. Blunting of  the right costophrenic angle may reflect a small pleural effusion. There is no pneumothorax.  IMPRESSION: Unchanged perihilar and bibasilar lung opacities compatible with edema.   Electronically Signed   By: Sebastian Ache   On: 11/26/2014 19:19   Dg Abd 1 View  11/26/2014   CLINICAL DATA:  Left lower quadrant abdominal tenderness. Shortness of breath. Increased agitation.  EXAM: ABDOMEN - 1 VIEW  COMPARISON:  None.  FINDINGS: The far left lateral aspect of the abdomen was incompletely imaged due to patient body habitus. Gas and a small to moderate amount of stool are present throughout the colon and rectum. No dilated loops of bowel are seen to suggest obstruction. Surgical clips are present in the pelvis. No acute osseous abnormality is seen.  IMPRESSION: Nonobstructed bowel-gas pattern.   Electronically Signed  By: Sebastian AcheAllen  Grady   On: 11/26/2014 19:10   Dg Chest Port 1 View  11/24/2014   CLINICAL DATA:  Increased shortness of breath today.  EXAM: PORTABLE CHEST - 1 VIEW  COMPARISON:  11/20/2014  FINDINGS: The heart is enlarged. There are increased airspace filling opacities in the lungs bilaterally. Suspect increased density in the left lung base partially obscuring the hemidiaphragm. However the left lung base is difficult to evaluate given the technique.  IMPRESSION: Cardiomegaly in increased airspace filling opacities bilaterally, consistent with edema.  Possible increased infiltrate or focal edema left lung base.   Electronically Signed   By: Norva PavlovElizabeth  Brown M.D.   On: 11/24/2014 15:46   Dg Chest Port 1 View  11/20/2014   CLINICAL DATA:  Shortness of breath for a couple hours, generalized chest pain in past 15 minutes, history CHF, dementia, renal disease  EXAM: PORTABLE CHEST - 1 VIEW  COMPARISON:  Portable exam 2144 hours without priors for comparison  FINDINGS: Enlargement of cardiac silhouette with pulmonary vascular congestion.  Mediastinal contours normal.  Perihilar infiltrates compatible with  pulmonary edema and CHF.  No segmental consolidation, pleural effusion or pneumothorax.  Bones demineralized.  IMPRESSION: CHF.   Electronically Signed   By: Ulyses SouthwardMark  Boles M.D.   On: 11/20/2014 22:17    Microbiology: No results found for this or any previous visit (from the past 240 hour(s)).   Labs: Basic Metabolic Panel:  Recent Labs Lab 11/24/14 0557 11/25/14 0628 11/26/14 0646 11/27/14 0653 11/28/14 0610  NA 137 140 139 139 139  K 4.6 4.3 4.5 4.2 4.5  CL 101 104 106 101 98*  CO2 30 32 31 32 31  GLUCOSE 107* 107* 111* 105* 130*  BUN 22* 22* 23* 25* 27*  CREATININE 1.41* 1.32* 1.31* 1.39* 1.38*  CALCIUM 8.3* 8.0* 8.2* 8.3* 8.7*   Liver Function Tests: No results for input(s): AST, ALT, ALKPHOS, BILITOT, PROT, ALBUMIN in the last 168 hours. No results for input(s): LIPASE, AMYLASE in the last 168 hours. No results for input(s): AMMONIA in the last 168 hours. CBC:  Recent Labs Lab 11/27/14 0653  WBC 5.4  HGB 12.0*  HCT 38.3*  MCV 89.7  PLT 251   Cardiac Enzymes: No results for input(s): CKTOTAL, CKMB, CKMBINDEX, TROPONINI in the last 168 hours. BNP: BNP (last 3 results)  Recent Labs  11/20/14 2148 11/27/14 0653  BNP 1133.0* 1435.0*    ProBNP (last 3 results) No results for input(s): PROBNP in the last 8760 hours.  CBG: No results for input(s): GLUCAP in the last 168 hours.     Signed:  Maniah Nading  [facility] 11/28/2014, 2:03 PM

## 2014-11-28 NOTE — Care Management Note (Signed)
Case Management Note  Patient Details  Name: Rolinda RoanLassie Kerner MRN: [id number] Date of Birth: 04/01/1927  Subjective/Objective:                    Action/Plan:   Expected Discharge Date:                  Expected Discharge Plan:  Assisted Living / Rest Home  In-House Referral:  Clinical Social Work  Discharge planning Services  CM Consult  Post Acute Care Choice:  Home Health Choice offered to:  [facility]  DME Arranged:    DME Agency:     HH Arranged:  PT HH Agency:  [facility]  Status of Service:  Completed, signed off  Medicare Important Message Given:  Yes Date Medicare IM Given:  11/21/14 Medicare IM give by:  Kathyrn SheriffJessica Childress, RN, MSN, CM  Date Additional Medicare IM Given:  11/27/14 Additional Medicare Important Message give by:  Kathyrn SheriffJessica Childress, RN, MSN, CM   If discussed at Long Length of Stay Meetings, dates discussed:    Additional Comments: Pt [facility]. HH services to start on Dec 02, 2014. CSW to arrange discharge to facility. Arlyss QueenBlackwell, Sharmel Ballantine [place], RN 11/28/2014, 12:57 PM

## 2014-12-05 ENCOUNTER — Encounter: Payer: Medicare Other | Admitting: Cardiology

## 2015-01-02 ENCOUNTER — Ambulatory Visit (INDEPENDENT_AMBULATORY_CARE_PROVIDER_SITE_OTHER): Payer: Medicare Other | Admitting: Cardiology

## 2015-01-02 ENCOUNTER — Encounter: Payer: Self-pay | Admitting: Cardiology

## 2015-01-02 VITALS — BP 110/70 | HR 40 | Ht 68.0 in | Wt 178.0 lb

## 2015-01-02 DIAGNOSIS — I5022 Chronic systolic (congestive) heart failure: Secondary | ICD-10-CM | POA: Diagnosis not present

## 2015-01-02 DIAGNOSIS — R001 Bradycardia, unspecified: Secondary | ICD-10-CM

## 2015-01-02 NOTE — Progress Notes (Signed)
Clinical Summary John Kidd is a 81 y.o.male seen today as a new patient for the following medical problems.  1. Chronic systolic heart failure - recent admit 11/2014 with volume overloaded, weight 204 lbs down to 180 lbs at discharge. Weight stable today at 178 lbs - echo 11/2014 LVEF 20-25%, abnormal diastolic function. Patient and family deny any known previous history of heart failure - ACE-I allergy listed, no details given  - denies any SOB/DOE. No LE edema.  - compliant with meds - recent fall, patient is unsure of details, does not remember well due to severe dementia  2. Advanced dementia - lives in nursing home  Past Medical History  Diagnosis Date  . CHF (congestive heart failure)   . Alzheimer's dementia   . Renal disorder      Allergies  Allergen Reactions  . Ace Inhibitors      Current Outpatient Prescriptions  Medication Sig Dispense Refill  . acetaminophen (TYLENOL) 500 MG tablet Take 500 mg by mouth every 4 (four) hours as needed (**To take every 4 hours as needed for 24 hours for fever 99.5 to 101. to monitor temperature three times daily until normal. If over 101, contact MD but not to exceed 2000mg  in 24 hours).    Marland Kitchen. albuterol (PROVENTIL HFA;VENTOLIN HFA) 108 (90 BASE) MCG/ACT inhaler Inhale 2 puffs into the lungs 2 (two) times daily. 1 Inhaler 2  . alum & mag hydroxide-simeth (MAALOX/MYLANTA) 200-200-20 MG/5ML suspension Take 30 mLs by mouth daily as needed for indigestion or heartburn.    Marland Kitchen. aspirin EC 81 MG tablet Take 81 mg by mouth daily.    . carvedilol (COREG) 3.125 MG tablet Take 1 tablet (3.125 mg total) by mouth 2 (two) times daily with a meal. 60 tablet 6  . Cholecalciferol (VITAMIN D) 2000 UNITS CAPS Take 1 capsule by mouth daily.    Marland Kitchen. docusate sodium (COLACE) 100 MG capsule Take 100 mg by mouth 2 (two) times daily.    . furosemide (LASIX) 40 MG tablet Take 1.5 tablets (60 mg total) by mouth daily. 45 tablet 6  . guaiFENesin (ROBITUSSIN) 100  MG/5ML liquid Take 200 mg by mouth every 6 (six) hours as needed for cough.    . loperamide (IMODIUM) 2 MG capsule Take 2 mg by mouth as needed for diarrhea or loose stools.    Marland Kitchen. LORazepam (ATIVAN) 0.5 MG tablet Take 1 tablet (0.5 mg total) by mouth 2 (two) times daily as needed (agitation). 30 tablet 0  . magnesium hydroxide (MILK OF MAGNESIA) 400 MG/5ML suspension Take 30 mLs by mouth at bedtime as needed for mild constipation.    . memantine (NAMENDA) 10 MG tablet Take 10 mg by mouth 2 (two) times daily.    . mirtazapine (REMERON) 45 MG tablet Take 45 mg by mouth at bedtime.    . polyethylene glycol (MIRALAX / GLYCOLAX) packet Take 17 g by mouth daily.    . risperiDONE (RISPERDAL) 2 MG tablet Take 2 mg by mouth 2 (two) times daily.    . traZODone (DESYREL) 100 MG tablet Take 100 mg by mouth at bedtime.     No current facility-administered medications for this visit.     No past surgical history on file.   Allergies  Allergen Reactions  . Ace Inhibitors       No family history on file.   Social History John Kidd reports that he has never smoked. He does not have any smokeless tobacco history on file.  John Kidd reports that he does not drink alcohol.   Review of Systems CONSTITUTIONAL: No weight loss, fever, chills, weakness or fatigue.  HEENT: Eyes: No visual loss, blurred vision, double vision or yellow sclerae.No hearing loss, sneezing, congestion, runny nose or sore throat.  SKIN: No rash or itching.  CARDIOVASCULAR: per HPI RESPIRATORY: No shortness of breath, cough or sputum.  GASTROINTESTINAL: No anorexia, nausea, vomiting or diarrhea. No abdominal pain or blood.  GENITOURINARY: No burning on urination, no polyuria NEUROLOGICAL: No headache, dizziness, syncope, paralysis, ataxia, numbness or tingling in the extremities. No change in bowel or bladder control.  MUSCULOSKELETAL: No muscle, back pain, joint pain or stiffness.  LYMPHATICS: No enlarged nodes. No history  of splenectomy.  PSYCHIATRIC: No history of depression or anxiety.  ENDOCRINOLOGIC: No reports of sweating, cold or heat intolerance. No polyuria or polydipsia.  Marland Kitchen   Physical Examination Filed Vitals:   01/02/15 1043  BP: 110/70  Pulse: 40   Filed Vitals:   01/02/15 1043  Height:  (1.727 m)  Weight: 178 lb (80.74 kg)    Gen: resting comfortably, no acute distress HEENT: no scleral icterus, pupils equal round and reactive, no palptable cervical adenopathy,  CV: RRR, no m/r/g, no JVD Resp: Clear to auscultation bilaterally GI: abdomen is soft, non-tender, non-distended, normal bowel sounds, no hepatosplenomegaly MSK: extremities are warm, no edema.  Skin: warm, no rash Neuro:  no focal deficits Psych: appropriate affect   Diagnostic Studies  11/2014 echo Study Conclusions  - Left ventricle: The cavity size was normal. Wall thickness was normal. Systolic function was severely reduced. The estimated ejection fraction was in the range of 20% to 25%. Diastolic function is abnormal, indeterminate grade. Diffuse hypokinesis. - Aortic valve: Moderately calcified annulus. Trileaflet; moderately thickened leaflets. Valve area (VTI): 2.34 cm^2. Valve area (Vmax): 2.65 cm^2. - Mitral valve: Mildly calcified annulus. Mildly thickened leaflets . There was mild regurgitation. The MR vena contracta is 0.3 cm. - Left atrium: The atrium was mildly dilated. - Right ventricle: The cavity size was mildly dilated. - Tricuspid valve: There was moderate regurgitation. - Pulmonary arteries: Systolic pressure was moderately increased. PA peak pressure: 50 mm Hg (S). - Inferior vena cava: The vessel was dilated. The respirophasic diameter changes were blunted (< 50%), consistent with elevated central venous pressure.   Assessment and Plan  1. Chronic systolic HF - appears to be new diagnosis made 11/2014, LVEF 20-25% - patient appears euvolemic, stable weights since  discharge - given severe advanced dementia will not pursue cath, continue medical therapy - medical therapy limited due to soft bp's, patient with recent fall that he unfortunately does not remember the details. Also with ACE allergy - continue current meds, avoid aggressive titration due to severe overall fraility w/ risk of side effects and soft bp   F/u 6 months      Antoine Poche, M.D.

## 2015-01-02 NOTE — Patient Instructions (Signed)
Your physician wants you to follow-up in: 6 months with Dr.Branch You will receive a reminder letter in the mail two months in advance. If you don't receive a letter, please call our office to schedule the follow-up appointment.   Your physician recommends that you continue on your current medications as directed. Please refer to the Current Medication list given to you today.    Thank you for choosing [facility] !        

## 2015-07-02 ENCOUNTER — Encounter: Payer: Self-pay | Admitting: Cardiology

## 2015-07-02 ENCOUNTER — Ambulatory Visit (INDEPENDENT_AMBULATORY_CARE_PROVIDER_SITE_OTHER): Payer: Medicare HMO | Admitting: Cardiology

## 2015-07-02 ENCOUNTER — Encounter: Payer: Medicare Other | Admitting: Cardiology

## 2015-07-02 VITALS — BP 108/68 | HR 70 | Ht 68.0 in | Wt 168.0 lb

## 2015-07-02 DIAGNOSIS — I5022 Chronic systolic (congestive) heart failure: Secondary | ICD-10-CM | POA: Diagnosis not present

## 2015-07-02 MED ORDER — FUROSEMIDE 40 MG PO TABS: 40.0000 mg | ORAL_TABLET | Freq: Every day | ORAL | Status: AC

## 2015-07-02 NOTE — Progress Notes (Signed)
Patient ID: John Kidd, male   DOB: 01/30/1927, 81 y.o.   MRN: [id number]     Clinical Summary John Kidd is a 81 y.o.male seen today for follow up of the following medical problems.   1. Chronic systolic heart failure - echo 10/979 LVEF 20-25%, abnormal diastolic function. Patient and family deny any known previous history of heart failure - ACE-I allergy listed, no details given  - he denies any SOB or DOE. No LE edema, no orthopnea. No chest pain.   2. Advanced dementia - lives in nursing home Past Medical History  Diagnosis Date  . CHF (congestive heart failure)   . Alzheimer's dementia   . Renal disorder      Allergies  Allergen Reactions  . Ace Inhibitors      Current Outpatient Prescriptions  Medication Sig Dispense Refill  . acetaminophen (TYLENOL) 500 MG tablet Take 500 mg by mouth every 4 (four) hours as needed (**To take every 4 hours as needed for 24 hours for fever 99.5 to 101. to monitor temperature three times daily until normal. If over 101, contact MD but not to exceed  in 24 hours).    Marland Kitchen albuterol (PROVENTIL HFA;VENTOLIN HFA) 108 (90 BASE) MCG/ACT inhaler Inhale 2 puffs into the lungs 2 (two) times daily. 1 Inhaler 2  . alum & mag hydroxide-simeth (MAALOX/MYLANTA) 200-200-20 MG/5ML suspension Take 30 mLs by mouth daily as needed for indigestion or heartburn.    Marland Kitchen aspirin EC 81 MG tablet Take 81 mg by mouth daily.    . carvedilol (COREG) 3.125 MG tablet Take 1 tablet (3.125 mg total) by mouth 2 (two) times daily with a meal. 60 tablet 6  . Cholecalciferol (VITAMIN D) 2000 UNITS CAPS Take 1 capsule by mouth daily.    Marland Kitchen docusate sodium (COLACE) 100 MG capsule Take 100 mg by mouth 2 (two) times daily.    . furosemide (LASIX) 40 MG tablet Take 1.5 tablets (60 mg total) by mouth daily. 45 tablet 6  . guaiFENesin (ROBITUSSIN) 100 MG/5ML liquid Take 200 mg by mouth every 6 (six) hours as needed for cough.    . loperamide (IMODIUM) 2 MG capsule Take 2 mg by  mouth as needed for diarrhea or loose stools.    Marland Kitchen LORazepam (ATIVAN) 0.5 MG tablet Take 1 tablet (0.5 mg total) by mouth 2 (two) times daily as needed (agitation). 30 tablet 0  . magnesium hydroxide (MILK OF MAGNESIA) 400 MG/5ML suspension Take 30 mLs by mouth at bedtime as needed for mild constipation.    . memantine (NAMENDA) 10 MG tablet Take 10 mg by mouth 2 (two) times daily.    . mirtazapine (REMERON) 45 MG tablet Take 45 mg by mouth at bedtime.    . polyethylene glycol (MIRALAX / GLYCOLAX) packet Take 17 g by mouth daily.    . risperiDONE (RISPERDAL) 2 MG tablet Take 2 mg by mouth 2 (two) times daily.    . traZODone (DESYREL) 100 MG tablet Take 100 mg by mouth at bedtime.     No current facility-administered medications for this visit.     No past surgical history on file.   Allergies  Allergen Reactions  . Ace Inhibitors       No family history on file.   Social History John Kidd reports that he has never smoked. He does not have any smokeless tobacco history on file. John Kidd reports that he does not drink alcohol.   Review of Systems CONSTITUTIONAL: No weight loss, fever,  chills, weakness or fatigue.  HEENT: Eyes: No visual loss, blurred vision, double vision or yellow sclerae.No hearing loss, sneezing, congestion, runny nose or sore throat.  SKIN: No rash or itching.  CARDIOVASCULAR: per hpi RESPIRATORY: No shortness of breath, cough or sputum.  GASTROINTESTINAL: No anorexia, nausea, vomiting or diarrhea. No abdominal pain or blood.  GENITOURINARY: No burning on urination, no polyuria NEUROLOGICAL: No headache, dizziness, syncope, paralysis, ataxia, numbness or tingling in the extremities. No change in bowel or bladder control.  MUSCULOSKELETAL: No muscle, back pain, joint pain or stiffness.  LYMPHATICS: No enlarged nodes. No history of splenectomy.  PSYCHIATRIC: No history of depression or anxiety.  ENDOCRINOLOGIC: No reports of sweating, cold or heat  intolerance. No polyuria or polydipsia.  Marland Kitchen.   Physical Examination Filed Vitals:   07/02/15 1601  BP: 108/68  Pulse: 70   Filed Vitals:   07/02/15 1601  Height: 5\' 8"  (1.727 m)  Weight: 168 lb (76.204 kg)    Gen: resting comfortably, no acute distress HEENT: no scleral icterus, pupils equal round and reactive, no palptable cervical adenopathy,  CV:per hpi Resp: Clear to auscultation bilaterally GI: abdomen is soft, non-tender, non-distended, normal bowel sounds, no hepatosplenomegaly MSK: extremities are warm, no edema.  Skin: warm, no rash Neuro:  no focal deficits Psych: appropriate affect   Diagnostic Studies  11/2014 echo Study Conclusions  - Left ventricle: The cavity size was normal. Wall thickness was normal. Systolic function was severely reduced. The estimated ejection fraction was in the range of 20% to 25%. Diastolic function is abnormal, indeterminate grade. Diffuse hypokinesis. - Aortic valve: Moderately calcified annulus. Trileaflet; moderately thickened leaflets. Valve area (VTI): 2.34 cm^2. Valve area (Vmax): 2.65 cm^2. - Mitral valve: Mildly calcified annulus. Mildly thickened leaflets . There was mild regurgitation. The MR vena contracta is 0.3 cm. - Left atrium: The atrium was mildly dilated. - Right ventricle: The cavity size was mildly dilated. - Tricuspid valve: There was moderate regurgitation. - Pulmonary arteries: Systolic pressure was moderately increased. PA peak pressure: 50 mm Hg (S). - Inferior vena cava: The vessel was dilated. The respirophasic diameter changes were blunted (< 50%), consistent with elevated central venous pressure.    Assessment and Plan   1. Chronic systolic HF - echo 11/2014, LVEF 20-25% - no current symptoms - given severe advanced dementia will not pursue cath, continue medical therapy. Soft bp's today, no medication titration.        Antoine PocheJonathan F. Aurel Nguyen, M.D.

## 2015-07-02 NOTE — Progress Notes (Signed)
Patient ID: John Kidd, male   DOB: [birth year], 81 y.o.   MRN: [id number]     Clinical Summary John Kidd is a 81 y.o.male  1. Chronic systolic heart failure - recent admit 11/2014 with volume overloaded, weight 204 lbs down to 180 lbs at discharge. Weight stable today at 178 lbs - echo 11/2014 LVEF 20-25%, abnormal diastolic function. Patient and family deny any known previous history of heart failure - ACE-I allergy listed, no details given  - denies any SOB/DOE. No LE edema.  - compliant with meds - recent fall, patient is unsure of details, does not remember well due to severe dementia  2. Advanced dementia - lives in nursing home Past Medical History  Diagnosis Date  . CHF (congestive heart failure)   . Alzheimer's dementia   . Renal disorder      Allergies  Allergen Reactions  . Ace Inhibitors      Current Outpatient Prescriptions  Medication Sig Dispense Refill  . acetaminophen (TYLENOL) 500 MG tablet Take 500 mg by mouth every 4 (four) hours as needed (**To take every 4 hours as needed for 24 hours for fever 99.5 to 101. to monitor temperature three times daily until normal. If over 101, contact MD but not to exceed  in 24 hours).    Marland Kitchen albuterol (PROVENTIL HFA;VENTOLIN HFA) 108 (90 BASE) MCG/ACT inhaler Inhale 2 puffs into the lungs 2 (two) times daily. 1 Inhaler 2  . alum & mag hydroxide-simeth (MAALOX/MYLANTA) 200-200-20 MG/5ML suspension Take 30 mLs by mouth daily as needed for indigestion or heartburn.    Marland Kitchen aspirin EC 81 MG tablet Take 81 mg by mouth daily.    . carvedilol (COREG) 3.125 MG tablet Take 1 tablet (3.125 mg total) by mouth 2 (two) times daily with a meal. 60 tablet 6  . Cholecalciferol (VITAMIN D) 2000 UNITS CAPS Take 1 capsule by mouth daily.    Marland Kitchen docusate sodium (COLACE) 100 MG capsule Take 100 mg by mouth 2 (two) times daily.    . furosemide (LASIX) 40 MG tablet Take 1.5 tablets (60 mg total) by mouth daily. 45 tablet 6  . guaiFENesin  (ROBITUSSIN) 100 MG/5ML liquid Take 200 mg by mouth every 6 (six) hours as needed for cough.    . loperamide (IMODIUM) 2 MG capsule Take 2 mg by mouth as needed for diarrhea or loose stools.    Marland Kitchen LORazepam (ATIVAN) 0.5 MG tablet Take 1 tablet (0.5 mg total) by mouth 2 (two) times daily as needed (agitation). 30 tablet 0  . magnesium hydroxide (MILK OF MAGNESIA) 400 MG/5ML suspension Take 30 mLs by mouth at bedtime as needed for mild constipation.    . memantine (NAMENDA) 10 MG tablet Take 10 mg by mouth 2 (two) times daily.    . mirtazapine (REMERON) 45 MG tablet Take 45 mg by mouth at bedtime.    . polyethylene glycol (MIRALAX / GLYCOLAX) packet Take 17 g by mouth daily.    . risperiDONE (RISPERDAL) 2 MG tablet Take 2 mg by mouth 2 (two) times daily.    . traZODone (DESYREL) 100 MG tablet Take 100 mg by mouth at bedtime.     No current facility-administered medications for this visit.     No past surgical history on file.   Allergies  Allergen Reactions  . Ace Inhibitors       No family history on file.   Social History John Kidd reports that he has never smoked. He does not have any smokeless  tobacco history on file. John Kidd reports that he does not drink alcohol.   Review of Systems CONSTITUTIONAL: No weight loss, fever, chills, weakness or fatigue.  HEENT: Eyes: No visual loss, blurred vision, double vision or yellow sclerae.No hearing loss, sneezing, congestion, runny nose or sore throat.  SKIN: No rash or itching.  CARDIOVASCULAR:  RESPIRATORY: No shortness of breath, cough or sputum.  GASTROINTESTINAL: No anorexia, nausea, vomiting or diarrhea. No abdominal pain or blood.  GENITOURINARY: No burning on urination, no polyuria NEUROLOGICAL: No headache, dizziness, syncope, paralysis, ataxia, numbness or tingling in the extremities. No change in bowel or bladder control.  MUSCULOSKELETAL: No muscle, back pain, joint pain or stiffness.  LYMPHATICS: No enlarged nodes.  No history of splenectomy.  PSYCHIATRIC: No history of depression or anxiety.  ENDOCRINOLOGIC: No reports of sweating, cold or heat intolerance. No polyuria or polydipsia.  Marland Kitchen.   Physical Examination There were no vitals filed for this visit. There were no vitals filed for this visit.  Gen: resting comfortably, no acute distress HEENT: no scleral icterus, pupils equal round and reactive, no palptable cervical adenopathy,  CV Resp: Clear to auscultation bilaterally GI: abdomen is soft, non-tender, non-distended, normal bowel sounds, no hepatosplenomegaly MSK: extremities are warm, no edema.  Skin: warm, no rash Neuro:  no focal deficits Psych: appropriate affect   Diagnostic Studies 11/2014 echo Study Conclusions  - Left ventricle: The cavity size was normal. Wall thickness was normal. Systolic function was severely reduced. The estimated ejection fraction was in the range of 20% to 25%. Diastolic function is abnormal, indeterminate grade. Diffuse hypokinesis. - Aortic valve: Moderately calcified annulus. Trileaflet; moderately thickened leaflets. Valve area (VTI): 2.34 cm^2. Valve area (Vmax): 2.65 cm^2. - Mitral valve: Mildly calcified annulus. Mildly thickened leaflets . There was mild regurgitation. The MR vena contracta is 0.3 cm. - Left atrium: The atrium was mildly dilated. - Right ventricle: The cavity size was mildly dilated. - Tricuspid valve: There was moderate regurgitation. - Pulmonary arteries: Systolic pressure was moderately increased. PA peak pressure: 50 mm Hg (S). - Inferior vena cava: The vessel was dilated. The respirophasic diameter changes were blunted (< 50%), consistent with elevated central venous pressure.    Assessment and Plan   1. Chronic systolic HF - appears to be new diagnosis made 11/2014, LVEF 20-25% - patient appears euvolemic, stable weights since discharge - given severe advanced dementia will not pursue cath,  continue medical therapy - medical therapy limited due to soft bp's, patient with recent fall that he unfortunately does not remember the details. Also with ACE allergy - continue current meds, avoid aggressive titration due to severe overall fraility w/ risk of side effects and soft bp      Antoine PocheJonathan F. Yukiko Minnich, M.D.

## 2015-07-02 NOTE — Patient Instructions (Signed)
Your physician wants you to follow-up in: 6 Months.  You will receive a reminder letter in the mail two months in advance. If you don't receive a letter, please call our office to schedule the follow-up appointment.  Your physician recommends that you continue on your current medications as directed. Please refer to the Current Medication list given to you today.  If you need a refill on your cardiac medications before your next appointment, please call your pharmacy.  Thank you for choosing [facility]!   

## 2015-12-03 DEATH — deceased

## 2016-03-24 NOTE — Progress Notes (Signed)
This encounter was created in error - please disregard.

## 2016-07-13 IMAGING — CR DG CHEST 1V PORT
1 series · 1 of 1 positions shown · non-contrast
Comparison: Portable exam 4800 hours without priors for comparison

CLINICAL DATA: Shortness of breath for a couple hours, generalized
chest pain in past 15 minutes, history CHF, dementia, renal disease

EXAM:
PORTABLE CHEST - 1 VIEW

[ap portable]
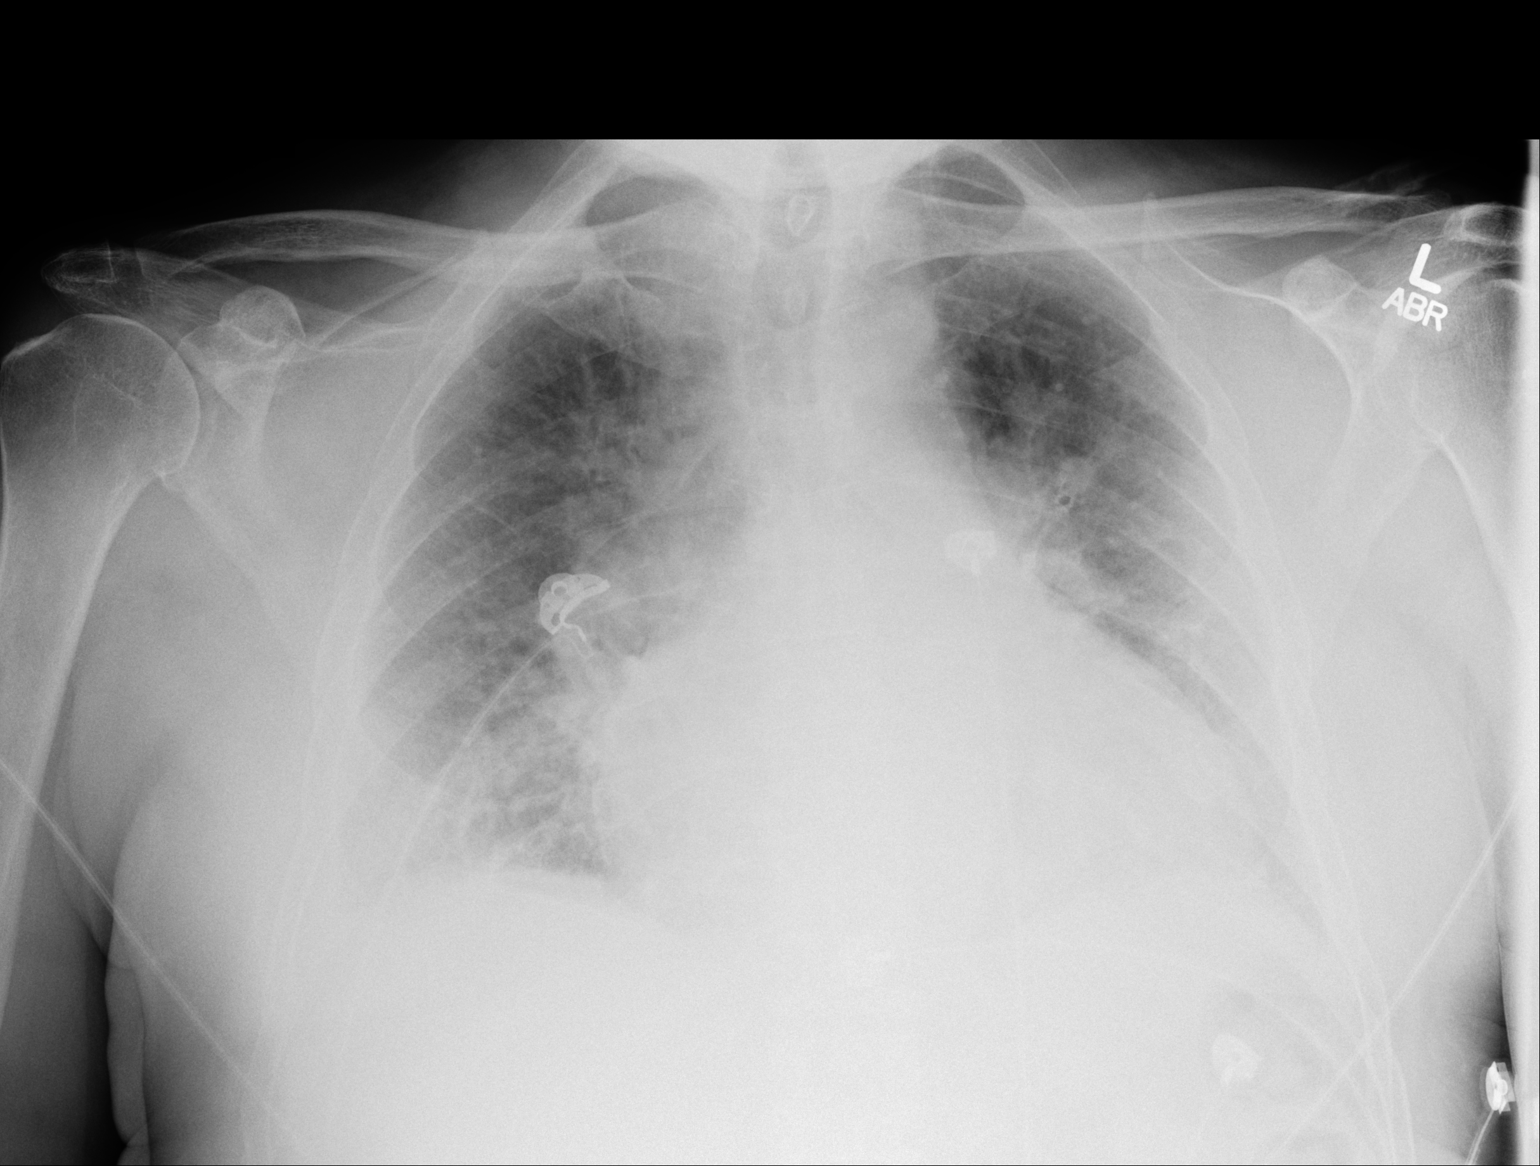

[1 of 1 positions shown; findings below may reference images not displayed]

FINDINGS: Enlargement of cardiac silhouette with pulmonary vascular
congestion.

Mediastinal contours normal.

Perihilar infiltrates compatible with pulmonary edema and CHF.

No segmental consolidation, pleural effusion or pneumothorax.

Bones demineralized.
IMPRESSION: CHF.

## 2016-07-17 IMAGING — CR DG CHEST 1V PORT
1 series · 1 of 1 positions shown · non-contrast
Comparison: 11/20/2014

CLINICAL DATA: Increased shortness of breath today.

EXAM:
PORTABLE CHEST - 1 VIEW

[ap portable]
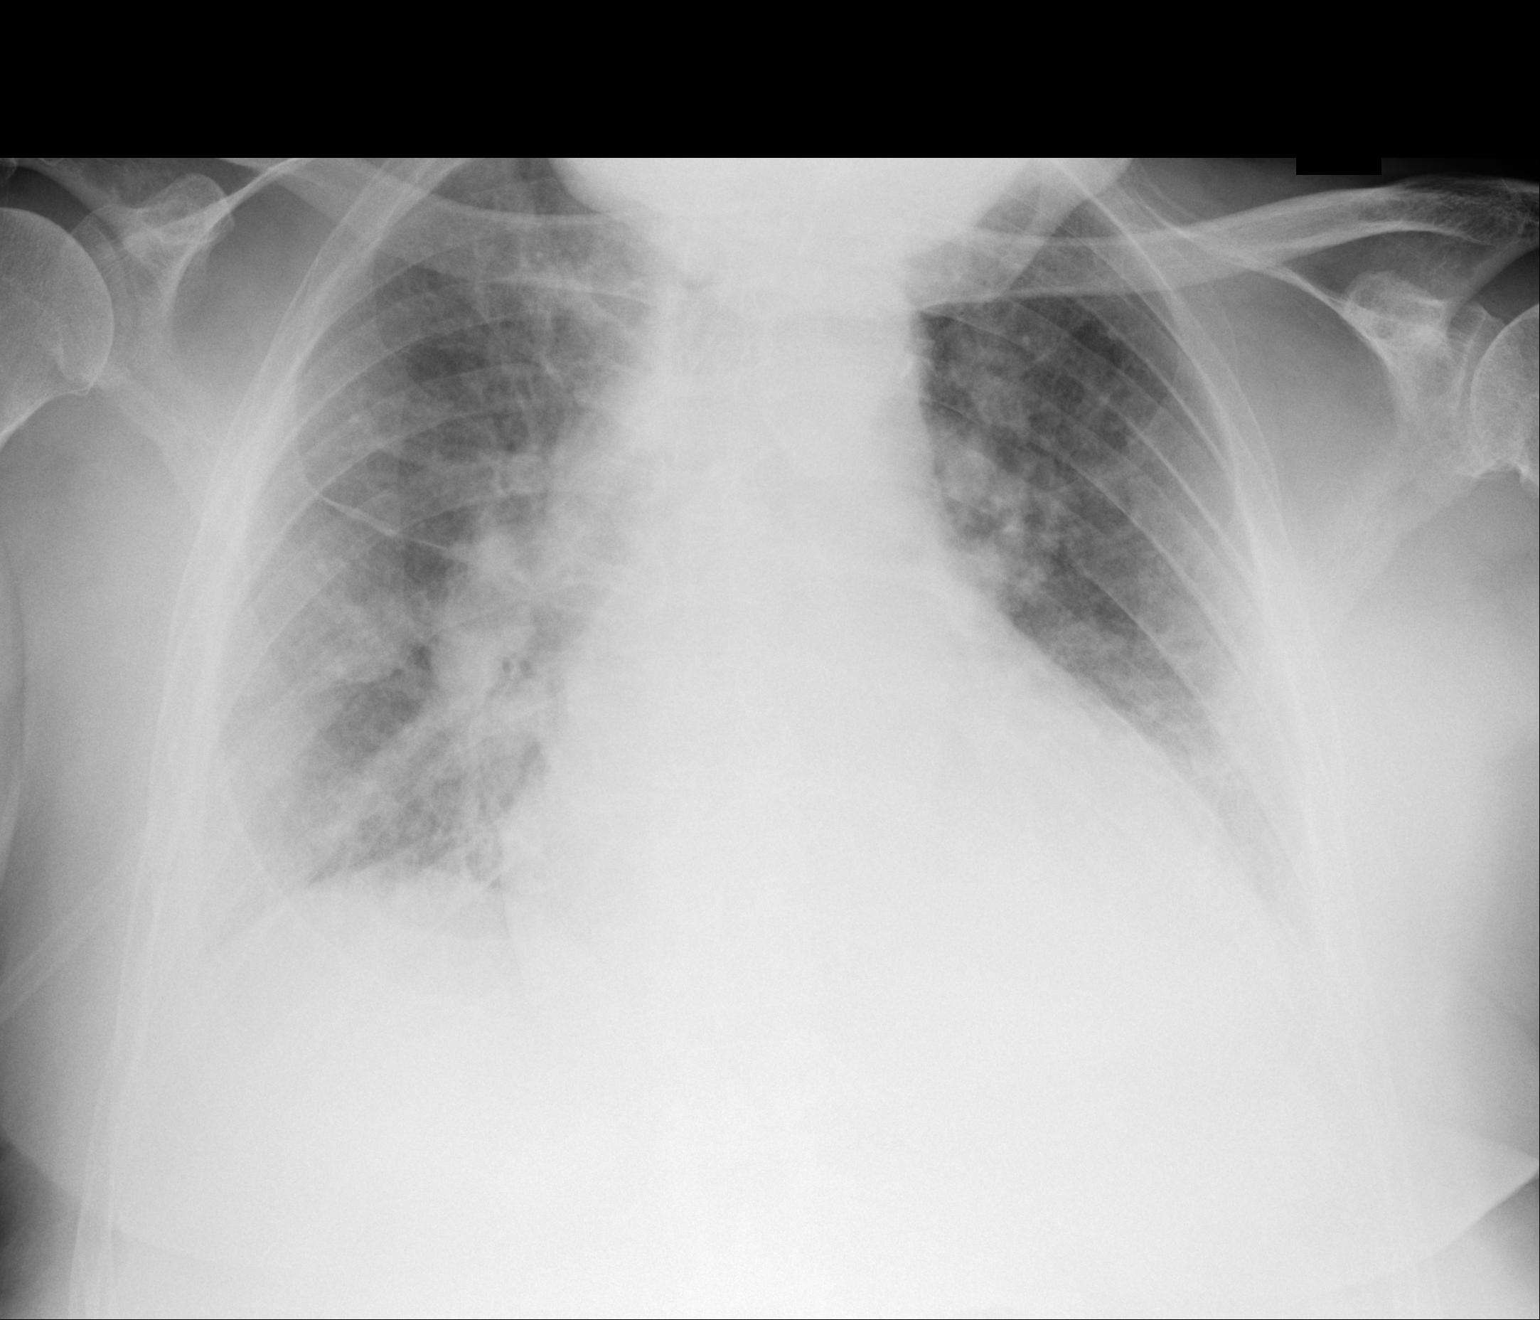

[1 of 1 positions shown; findings below may reference images not displayed]

FINDINGS: The heart is enlarged. There are increased airspace filling
opacities in the lungs bilaterally. Suspect increased density in the
left lung base partially obscuring the hemidiaphragm. However the
left lung base is difficult to evaluate given the technique.
IMPRESSION: Cardiomegaly in increased airspace filling opacities bilaterally,
consistent with edema.

Possible increased infiltrate or focal edema left lung base.
